# Patient Record
Sex: Male | Born: 1985 | Race: White | Hispanic: No | Marital: Married | Smoking: Current every day smoker
Health system: Southern US, Community
[De-identification: ages and names within clinical notes are randomized; demographics above are authoritative.]

## PROBLEM LIST (undated history)

## (undated) DIAGNOSIS — J449 Chronic obstructive pulmonary disease, unspecified: Secondary | ICD-10-CM

## (undated) DIAGNOSIS — S069X9A Unspecified intracranial injury with loss of consciousness of unspecified duration, initial encounter: Secondary | ICD-10-CM

## (undated) DIAGNOSIS — F319 Bipolar disorder, unspecified: Secondary | ICD-10-CM

## (undated) DIAGNOSIS — S069XAA Unspecified intracranial injury with loss of consciousness status unknown, initial encounter: Secondary | ICD-10-CM

## (undated) HISTORY — PX: NO PAST SURGERIES: SHX2092

---
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
## 2012-10-28 ENCOUNTER — Emergency Department (HOSPITAL_COMMUNITY)
Admission: EM | Admit: 2012-10-28 | Discharge: 2012-10-28 | Disposition: A | Discharge status: Home / Self Care | Payer: Self-pay | Attending: Emergency Medicine | Admitting: Emergency Medicine

## 2012-10-28 ENCOUNTER — Encounter (HOSPITAL_COMMUNITY): Payer: Self-pay | Admitting: Adult Health

## 2012-10-28 DIAGNOSIS — L259 Unspecified contact dermatitis, unspecified cause: Secondary | ICD-10-CM | POA: Insufficient documentation

## 2012-10-28 DIAGNOSIS — F17200 Nicotine dependence, unspecified, uncomplicated: Secondary | ICD-10-CM | POA: Insufficient documentation

## 2012-10-28 DIAGNOSIS — Z87820 Personal history of traumatic brain injury: Secondary | ICD-10-CM | POA: Insufficient documentation

## 2012-10-28 HISTORY — DX: Unspecified intracranial injury with loss of consciousness of unspecified duration, initial encounter: S06.9X9A

## 2012-10-28 HISTORY — DX: Unspecified intracranial injury with loss of consciousness status unknown, initial encounter: S06.9XAA

## 2012-10-28 MED ORDER — HYDROCORTISONE 1 % EX CREA: TOPICAL_CREAM | CUTANEOUS | Status: DC

## 2012-10-28 MED ORDER — PREDNISONE 20 MG PO TABS: 40.0000 mg | ORAL_TABLET | Freq: Every day | ORAL | Status: DC

## 2012-10-28 MED ORDER — DIPHENHYDRAMINE HCL 25 MG PO TABS: 25.0000 mg | ORAL_TABLET | Freq: Four times a day (QID) | ORAL | Status: DC | PRN

## 2012-10-28 NOTE — ED Notes (Signed)
Pt stated he is homeless.  Stated he noticed on red area on right arm yesterday and thought it was a mosquito bite.  The rash began to spread on right and left arm and on right and left hips.

## 2012-10-28 NOTE — ED Provider Notes (Signed)
History   This chart was scribed for Loren Racer, MD by Gerlean Ren, ED Scribe. This patient was seen in room TR05C/TR05C and the patient's care was started at 10:26 PM    CSN: [id number]  Arrival date & time 10/28/12  2047   First MD Initiated Contact with Patient 10/28/12 2207      Chief Complaint  Patient presents with  . Rash    The history is provided by the patient. No language interpreter was used.   Kristopher Kim is a 26 y.o. male who presents to the Emergency Department complaining of a mildly itching rash that began on left upper extremity yesterday and has spread to right arm and chest since. Pt denies any h/o similar rash.  Pt has h/o traumatic brain injury.  Pt is a current everyday smoker but denies alcohol use.   Past Medical History  Diagnosis Date  . Traumatic brain injury     History reviewed. No pertinent past surgical history.  History reviewed. No pertinent family history.  History  Substance Use Topics  . Smoking status: Current Every Day Smoker    Types: Cigarettes  . Smokeless tobacco: Not on file  . Alcohol Use: No      Review of Systems  Respiratory: Negative for shortness of breath.   Skin: Positive for rash.    Allergies  Ceclor and Penicillins  Home Medications   Current Outpatient Rx  Name  Route  Sig  Dispense  Refill  . DIPHENHYDRAMINE HCL 25 MG PO TABS   Oral   Take 1 tablet (25 mg total) by mouth every 6 (six) hours as needed for itching.   20 tablet   0   . HYDROCORTISONE 1 % EX CREA      Apply to affected area 2 times daily   15 g   0   . PREDNISONE 20 MG PO TABS   Oral   Take 2 tablets (40 mg total) by mouth daily.   10 tablet   0     BP 106/71  Pulse 62  Temp 98.3 F (36.8 C) (Oral)  Resp 18  SpO2 97%  Physical Exam  Nursing note and vitals reviewed. Constitutional: He is oriented to person, place, and time. He appears well-developed and well-nourished.  HENT:  Head: Normocephalic and  atraumatic.  Eyes: Conjunctivae normal and EOM are normal. Pupils are equal, round, and reactive to light.  Neck: Normal range of motion. Neck supple.  Cardiovascular: Normal rate, regular rhythm and normal heart sounds.   Pulmonary/Chest: Effort normal and breath sounds normal.  Abdominal: Soft. Bowel sounds are normal.  Musculoskeletal: Normal range of motion.  Neurological: He is alert and oriented to person, place, and time.  Skin: Skin is warm and dry. Rash noted.       Raised erythematous papules and macules over bilateral arms in linear patterns consistent with scratching and over chest.  No rash on back or face.  No rash on hands or webbing between fingers.  Psychiatric: He has a normal mood and affect.    ED Course  Procedures (including critical care time) DIAGNOSTIC STUDIES: Oxygen Saturation is 97% on room air, adequate by my interpretation.    COORDINATION OF CARE: 10:32 PM- Patient informed of clinical course, understands medical decision-making process, and agrees with plan.  Ordered hydrocortisone cream.      Labs Reviewed - No data to display No results found.   1. Contact dermatitis       MDM  I personally performed the services described in this documentation, which was scribed in my presence. The recorded information has been reviewed and is accurate.        Loren Racer, MD 10/29/12 [phone]

## 2012-10-28 NOTE — ED Notes (Signed)
Presents with bilateral extremity rash that began yesterday afternoon, rash has spread to trunk. Pt c/o itchiness.

## 2013-03-24 ENCOUNTER — Emergency Department (HOSPITAL_COMMUNITY): Payer: Self-pay

## 2013-03-24 ENCOUNTER — Emergency Department (HOSPITAL_COMMUNITY)
Admission: EM | Admit: 2013-03-24 | Discharge: 2013-03-24 | Disposition: A | Discharge status: Home / Self Care | Payer: Self-pay | Attending: Emergency Medicine | Admitting: Emergency Medicine

## 2013-03-24 ENCOUNTER — Encounter (HOSPITAL_COMMUNITY): Payer: Self-pay | Admitting: Emergency Medicine

## 2013-03-24 DIAGNOSIS — Z87820 Personal history of traumatic brain injury: Secondary | ICD-10-CM | POA: Insufficient documentation

## 2013-03-24 DIAGNOSIS — Z79899 Other long term (current) drug therapy: Secondary | ICD-10-CM | POA: Insufficient documentation

## 2013-03-24 DIAGNOSIS — F319 Bipolar disorder, unspecified: Secondary | ICD-10-CM | POA: Insufficient documentation

## 2013-03-24 DIAGNOSIS — Y929 Unspecified place or not applicable: Secondary | ICD-10-CM | POA: Insufficient documentation

## 2013-03-24 DIAGNOSIS — W108XXA Fall (on) (from) other stairs and steps, initial encounter: Secondary | ICD-10-CM | POA: Insufficient documentation

## 2013-03-24 DIAGNOSIS — S2232XA Fracture of one rib, left side, initial encounter for closed fracture: Secondary | ICD-10-CM

## 2013-03-24 DIAGNOSIS — S2239XA Fracture of one rib, unspecified side, initial encounter for closed fracture: Secondary | ICD-10-CM | POA: Insufficient documentation

## 2013-03-24 DIAGNOSIS — F17200 Nicotine dependence, unspecified, uncomplicated: Secondary | ICD-10-CM | POA: Insufficient documentation

## 2013-03-24 DIAGNOSIS — Y9301 Activity, walking, marching and hiking: Secondary | ICD-10-CM | POA: Insufficient documentation

## 2013-03-24 HISTORY — DX: Bipolar disorder, unspecified: F31.9

## 2013-03-24 MED ORDER — HYDROCODONE-ACETAMINOPHEN 5-325 MG PO TABS: 1.0000 | ORAL_TABLET | Freq: Three times a day (TID) | ORAL | Status: DC | PRN

## 2013-03-24 MED ORDER — IBUPROFEN 800 MG PO TABS
800.0000 mg | ORAL_TABLET | Freq: Three times a day (TID) | ORAL | Status: DC
Start: 2013-03-24 — End: 2013-05-25

## 2013-03-24 NOTE — ED Notes (Signed)
Pt c/o left sided rib pain after falling down stairs 5 days ago; pt denies SOB

## 2013-03-24 NOTE — ED Provider Notes (Signed)
History     CSN: [id number]  Arrival date & time 03/24/13  4098   First MD Initiated Contact with Patient 03/24/13 [phone]      Chief Complaint  Patient presents with  . Fall    (Consider location/radiation/quality/duration/timing/severity/associated sxs/prior treatment) Patient is a 27 y.o. male presenting with fall. The history is provided by the patient. No language interpreter was used.  Fall The accident occurred more than 2 days ago. The fall occurred while walking. He fell from a height of 3 to 5 ft. He landed on concrete. There was no blood loss. Point of impact: left side/back. Pain location: left side/back. The pain is at a severity of 6/10. The pain is moderate. He was ambulatory at the scene. There was no entrapment after the fall. There was no drug use involved in the accident. There was no alcohol use involved in the accident. Pertinent negatives include no visual change, no fever, no numbness, no abdominal pain, no bowel incontinence, no nausea, no vomiting, no hematuria, no headaches, no hearing loss, no loss of consciousness and no tingling. The symptoms are aggravated by activity (deep breathing). He has tried NSAIDs and rest for the symptoms. The treatment provided mild relief.  Pt c/o left side and back pain after falling down stairs 5 days ago.  States he was carrying tools down stairs when he tripped over his feet.  Denies hitting his head or loss of consciousness.  Pt states pain is sharp and dull in nature, is 3/10 at best and 6/10 at worst with deep breathing.  Certain movements and lying down also worsen the pain.  Did try Advil yesterday with some relief.  Denies shortness of breath.  Denies pain elsewhere.   Past Medical History  Diagnosis Date  . Traumatic brain injury   . Bipolar 1 disorder     History reviewed. No pertinent past surgical history.  History reviewed. No pertinent family history.  History  Substance Use Topics  . Smoking status: Current Every  Day Smoker    Types: Cigarettes  . Smokeless tobacco: Not on file  . Alcohol Use: Yes     Comment: occ      Review of Systems  Constitutional: Negative for fever.  Gastrointestinal: Negative for nausea, vomiting, abdominal pain and bowel incontinence.  Genitourinary: Negative for hematuria.  Neurological: Negative for tingling, loss of consciousness, numbness and headaches.    Allergies  Ceclor and Penicillins  Home Medications   Current Outpatient Rx  Name  Route  Sig  Dispense  Refill  . benztropine (COGENTIN) 1 MG tablet   Oral   Take 1 mg by mouth 2 (two) times daily.         . risperiDONE (RISPERDAL) 2 MG tablet   Oral   Take 2 mg by mouth 2 (two) times daily.         Marland Kitchen HYDROcodone-acetaminophen (NORCO/VICODIN) 5-325 MG per tablet   Oral   Take 1 tablet by mouth every 8 (eight) hours as needed for pain.   6 tablet   0   . ibuprofen (ADVIL,MOTRIN) 800 MG tablet   Oral   Take 1 tablet (800 mg total) by mouth 3 (three) times daily.   21 tablet   0     BP 96/56  Pulse 50  Temp(Src) 98.1 F (36.7 C) (Oral)  Resp 18  SpO2 97%  Physical Exam  Nursing note and vitals reviewed. Constitutional: He is oriented to person, place, and time. He appears well-developed  and well-nourished. No distress.  HENT:  Head: Normocephalic and atraumatic.  Eyes: Conjunctivae and EOM are normal. Pupils are equal, round, and reactive to light. Right eye exhibits no discharge. Left eye exhibits no discharge. No scleral icterus.  Neck: Normal range of motion. Neck supple. No JVD present. No tracheal deviation present. No thyromegaly present.  Cardiovascular: Regular rhythm and normal heart sounds.   Bradycardic   Pulmonary/Chest: Effort normal and breath sounds normal. No stridor. No respiratory distress. He has no wheezes. He has no rales. He exhibits tenderness ( left posterior chest wall).  Abdominal: Soft. Bowel sounds are normal. He exhibits no distension. There is no  tenderness.  Musculoskeletal: Normal range of motion. He exhibits edema and tenderness ( TTP over left back around T7-T10.  Palpable muscle spasm.  No abrassion, contusion or erythema.).  Lymphadenopathy:    He has no cervical adenopathy.  Neurological: He is alert and oriented to person, place, and time. No cranial nerve deficit. Coordination normal.  Skin: Skin is warm and dry. No rash noted. He is not diaphoretic. No erythema.  Psychiatric: He has a normal mood and affect. His behavior is normal. Judgment and thought content normal.    ED Course  Procedures (including critical care time)  Labs Reviewed - No data to display Dg Ribs Unilateral W/chest Left  03/24/2013  *RADIOLOGY REPORT*  Clinical Data: Fall, injury lateral mid left chest/ribs  LEFT RIBS AND CHEST - 3+ VIEW  Comparison: None Correlation:  CT chest 04/04/2009  Findings: Normal heart size, mediastinal contours, and pulmonary vascularity. Lungs clear. No pleural effusion or pneumothorax. BB placed at site of symptoms left chest. Questionable nondisplaced fracture posterior left eighth rib.  IMPRESSION: Questionable nondisplaced fracture posterior left eighth rib   Original Report Authenticated By: Ulyses Southward, M.D.      1. Rib fracture, left, closed, initial encounter       MDM  Pt c/o of 5 day hx of left sided back pain which started after he fell down some stairs.  Pain has not improved since fall.  Denies hitting his head or LOC.  Denies headache, blurred vision or loss of balance.  State he fell down the stairs while carrying some tools and tripped over his own feet.  Pt does work in [employer].  Has tried some Advil with some relief.  Lungs: CTAB.  TTP over posterior chest wall starting around T7-T10. Palpable muscle spasm appreciated over sight of tenderness. No wound, ecchymosis or erythema.  Dx: nondisplaced rib fraction, posterior left 8th rib.  Rx: norco and ibuprofen as needed for pain.  Will have pt f/u  with PCP or urgent care in 1-2wks if pain not improving.  Encouraged pt not to be afraid to take deep breaths.  Do not want pt to get pneumonia or atelectasis from shallow breathing. Advised pt of return precautions.   Vitals: unremarkable. Discharged in stable condition.    Discussed pt with attending during ED encounter.       Junius Finner, PA-C 03/24/13 1946

## 2013-03-26 NOTE — ED Provider Notes (Signed)
Medical screening examination/treatment/procedure(s) were performed by non-physician practitioner and as supervising physician I was immediately available for consultation/collaboration.   David H Yao, MD 03/26/13 0651 

## 2013-05-25 ENCOUNTER — Encounter (HOSPITAL_COMMUNITY): Payer: Self-pay | Admitting: Emergency Medicine

## 2013-05-25 ENCOUNTER — Emergency Department (HOSPITAL_COMMUNITY)
Admission: EM | Admit: 2013-05-25 | Discharge: 2013-05-25 | Disposition: A | Discharge status: Home / Self Care | Payer: Self-pay | Attending: Emergency Medicine | Admitting: Emergency Medicine

## 2013-05-25 ENCOUNTER — Emergency Department (HOSPITAL_COMMUNITY): Payer: Self-pay

## 2013-05-25 DIAGNOSIS — S8990XA Unspecified injury of unspecified lower leg, initial encounter: Secondary | ICD-10-CM | POA: Insufficient documentation

## 2013-05-25 DIAGNOSIS — S99919A Unspecified injury of unspecified ankle, initial encounter: Secondary | ICD-10-CM | POA: Insufficient documentation

## 2013-05-25 DIAGNOSIS — Y929 Unspecified place or not applicable: Secondary | ICD-10-CM | POA: Insufficient documentation

## 2013-05-25 DIAGNOSIS — F17200 Nicotine dependence, unspecified, uncomplicated: Secondary | ICD-10-CM | POA: Insufficient documentation

## 2013-05-25 DIAGNOSIS — Y939 Activity, unspecified: Secondary | ICD-10-CM | POA: Insufficient documentation

## 2013-05-25 DIAGNOSIS — Z880 Allergy status to penicillin: Secondary | ICD-10-CM | POA: Insufficient documentation

## 2013-05-25 DIAGNOSIS — M79672 Pain in left foot: Secondary | ICD-10-CM

## 2013-05-25 DIAGNOSIS — X58XXXA Exposure to other specified factors, initial encounter: Secondary | ICD-10-CM | POA: Insufficient documentation

## 2013-05-25 MED ORDER — NAPROXEN 500 MG PO TABS: 500.0000 mg | ORAL_TABLET | Freq: Two times a day (BID) | ORAL | Status: DC

## 2013-05-25 NOTE — ED Notes (Signed)
Pt. Stated, I was lifting with someone else a railroad tie and it dropped on my foot.  Rt. Big toe.

## 2013-05-25 NOTE — ED Provider Notes (Signed)
History    This chart was scribed for Doran Durand, non-physician practitioner working with Richardean Canal, MD by Leone Payor, ED Scribe. This patient was seen in room TR10C/TR10C and the patient's care was started at 1405.   CSN: [id number]  Arrival date & time 05/25/13  1405   First MD Initiated Contact with Patient 05/25/13 1527      Chief Complaint  Patient presents with  . Toe Injury     The history is provided by the patient. No language interpreter was used.    HPI Comments: Kristopher Kim is a 27 y.o. male who presents to the Emergency Department complaining of a R great toe and right foot injury that occurred 1.5 weeks ago. States a railroad tie was dropped on the top of his R foot in between his great toe and second toe. He rates his pain as 2-3/10 while sitting down, 4-5/10 while walking. His pain is severe when walking down stairs. Has taken Advil and flexeril with mild relief. He denies numbness or tingling.  He reports that he has noticed occasional swelling of the top of his right foot.  Pt is a current everyday smoker and occasional alcohol user.   Past Medical History  Diagnosis Date  . Traumatic brain injury   . Bipolar 1 disorder     History reviewed. No pertinent past surgical history.  No family history on file.  History  Substance Use Topics  . Smoking status: Current Every Day Smoker    Types: Cigarettes  . Smokeless tobacco: Not on file  . Alcohol Use: Yes     Comment: occ      Review of Systems  Musculoskeletal: Positive for arthralgias (toe pain).  Neurological: Negative for weakness and numbness.  All other systems reviewed and are negative.    Allergies  Ceclor and Penicillins  Home Medications   Current Outpatient Rx  Name  Route  Sig  Dispense  Refill  . cyclobenzaprine (FLEXERIL) 10 MG tablet   Oral   Take 10 mg by mouth 3 (three) times daily as needed for muscle spasms.         Marland Kitchen ibuprofen (ADVIL,MOTRIN) 200 MG tablet  Oral   Take 400 mg by mouth every 6 (six) hours as needed for pain.           BP 132/69  Pulse 68  Temp(Src) 98.2 F (36.8 C) (Oral)  Resp 20  SpO2 96%  Physical Exam  Nursing note and vitals reviewed. Constitutional: He appears well-developed and well-nourished.  HENT:  Head: Normocephalic and atraumatic.  Mouth/Throat: Oropharynx is clear and moist.  Eyes: EOM are normal. Pupils are equal, round, and reactive to light.  Neck: Normal range of motion. Neck supple.  Cardiovascular: Normal rate, regular rhythm and normal heart sounds.   Pulmonary/Chest: Effort normal and breath sounds normal. No respiratory distress. He has no wheezes. He has no rales.  Musculoskeletal: Normal range of motion. He exhibits tenderness.  R foot has tenderness to palpation over 1st metatarsal and 2nd metatarsal. No tenderness to palpation of 3rd-5th metatarsal. Able to wiggle toes. 2+ DP pulse. Sensation intact.   Neurological: He is alert.  Skin: Skin is warm and dry.  Psychiatric: He has a normal mood and affect. His behavior is normal.    ED Course  Procedures (including critical care time)  DIAGNOSTIC STUDIES: Oxygen Saturation is 96% on RA, adequate by my interpretation.    COORDINATION OF CARE: 4:41 PM Discussed treatment plan with  pt at bedside and pt agreed to plan.   Labs Reviewed - No data to display Dg Foot Complete Right  05/25/2013   *RADIOLOGY REPORT*  Clinical Data: Injury 1 week ago.  Pain along the anterior metatarsal bones.  RIGHT FOOT COMPLETE - 3+ VIEW  Comparison: None.  Findings: Three views of the right foot were obtained.  Negative for acute fracture or dislocation.  Alignment of the foot is within normal limits.  No gross soft tissue abnormality.  IMPRESSION: No acute bony abnormality to the right foot.   Original Report Authenticated By: Richarda Overlie, M.D.     No diagnosis found.    MDM  Patient presenting with foot pain after dropping a railroad tie on his foot 1.5  weeks ago.  Xray negative.  Patient neurovascularly intact.  Patient able to bear weight and ambulate.  Patient declined post op shoe or cam walker.  Feel that patient is stable for discharge.  I personally performed the services described in this documentation, which was scribed in my presence. The recorded information has been reviewed and is accurate.   Pascal Lux [place], PA-C 05/25/13 1655

## 2013-05-30 NOTE — ED Provider Notes (Signed)
Medical screening examination/treatment/procedure(s) were performed by non-physician practitioner and as supervising physician I was immediately available for consultation/collaboration.   Marquice Uddin H Taelar Gronewold, MD 05/30/13 0932 

## 2013-07-03 ENCOUNTER — Encounter (HOSPITAL_COMMUNITY): Payer: Self-pay

## 2013-07-03 ENCOUNTER — Emergency Department (HOSPITAL_COMMUNITY)
Admission: EM | Admit: 2013-07-03 | Discharge: 2013-07-04 | Disposition: A | Discharge status: Home / Self Care | Payer: Self-pay | Attending: Emergency Medicine | Admitting: Emergency Medicine

## 2013-07-03 ENCOUNTER — Emergency Department (HOSPITAL_COMMUNITY): Payer: Self-pay

## 2013-07-03 DIAGNOSIS — R112 Nausea with vomiting, unspecified: Secondary | ICD-10-CM | POA: Insufficient documentation

## 2013-07-03 DIAGNOSIS — F17200 Nicotine dependence, unspecified, uncomplicated: Secondary | ICD-10-CM | POA: Insufficient documentation

## 2013-07-03 DIAGNOSIS — Z880 Allergy status to penicillin: Secondary | ICD-10-CM | POA: Insufficient documentation

## 2013-07-03 DIAGNOSIS — Z8659 Personal history of other mental and behavioral disorders: Secondary | ICD-10-CM | POA: Insufficient documentation

## 2013-07-03 DIAGNOSIS — Z87820 Personal history of traumatic brain injury: Secondary | ICD-10-CM | POA: Insufficient documentation

## 2013-07-03 DIAGNOSIS — J40 Bronchitis, not specified as acute or chronic: Secondary | ICD-10-CM | POA: Insufficient documentation

## 2013-07-03 MED ORDER — DEXTROMETHORPHAN POLISTIREX 30 MG/5ML PO LQCR
15.0000 mg | Freq: Once | ORAL | Status: AC
  Administered 2013-07-03: 15 mg via ORAL
  Filled 2013-07-03: qty 5

## 2013-07-03 MED ORDER — ONDANSETRON 4 MG PO TBDP
4.0000 mg | ORAL_TABLET | Freq: Once | ORAL | Status: AC
  Administered 2013-07-03: 4 mg via ORAL
  Filled 2013-07-03: qty 1

## 2013-07-03 NOTE — ED Provider Notes (Signed)
CSN: [id number]     Arrival date & time 07/03/13  1953 History     First MD Initiated Contact with Patient 07/03/13 2349     Chief Complaint  Patient presents with  . Cough  . Nausea   (Consider location/radiation/quality/duration/timing/severity/associated sxs/prior Treatment) HPI Comments: Patient is a 27 year old male with a past medical history of bipolar 1 disorder who presents with a 3 day history of sinus pressure. Symptoms started gradually and progressively worsened since the onset. Patient reports associated productive cough with green mucous as well as vomiting. Patient denies any other associated symptoms. Patient has not tried anything for symptoms. No aggravating/alleviating factors. No known sick contacts.    Past Medical History  Diagnosis Date  . Traumatic brain injury   . Bipolar 1 disorder    History reviewed. No pertinent past surgical history. History reviewed. No pertinent family history. History  Substance Use Topics  . Smoking status: Current Every Day Smoker    Types: Cigarettes  . Smokeless tobacco: Not on file  . Alcohol Use: Yes     Comment: occ    Review of Systems  HENT: Positive for sinus pressure.   Respiratory: Positive for cough.   Gastrointestinal: Positive for nausea.  All other systems reviewed and are negative.    Allergies  Ceclor and Penicillins  Home Medications   Current Outpatient Rx  Name  Route  Sig  Dispense  Refill  . OVER THE COUNTER MEDICATION   Oral   Take 1 tablet by mouth once. OTC Day/Cold medication          BP 126/77  Pulse 76  Temp(Src) 98.3 F (36.8 C) (Oral)  Resp 20  SpO2 96% Physical Exam  Nursing note and vitals reviewed. Constitutional: He is oriented to person, place, and time. He appears well-developed and well-nourished. No distress.  HENT:  Head: Normocephalic and atraumatic.  Mouth/Throat: Oropharynx is clear and moist. No oropharyngeal exudate.  Eyes: Conjunctivae are normal.  Neck:  Normal range of motion.  Cardiovascular: Normal rate and regular rhythm.  Exam reveals no gallop and no friction rub.   No murmur heard. Pulmonary/Chest: Effort normal and breath sounds normal. He has no wheezes. He has no rales. He exhibits no tenderness.  Abdominal: Soft. There is no tenderness.  Musculoskeletal: Normal range of motion.  Lymphadenopathy:    He has no cervical adenopathy.  Neurological: He is alert and oriented to person, place, and time. Coordination normal.  Speech is goal-oriented. Moves limbs without ataxia.   Skin: Skin is warm and dry.  Psychiatric: He has a normal mood and affect. His behavior is normal.    ED Course   Procedures (including critical care time)  Labs Reviewed - No data to display Dg Chest 2 View  07/03/2013   *RADIOLOGY REPORT*  Clinical Data: Cough  CHEST - 2 VIEW  Comparison: March 24, 2013  Findings:  Lungs are clear.  Heart size and pulmonary vascularity are normal.  No adenopathy.  No bone lesions.  IMPRESSION: No abnormality noted.   Original Report Authenticated By: Bretta Bang, M.D.   1. Bronchitis     MDM  11:57 PM Chest xray unremarkable for acute changes. Patient will have delsym for cough and zofran for nausea. Vitals stable and patient afebrile at this time.   12:27 AM Patient likely has bronchitis and will have prescription for azithromycin, delsym and zofran. Patient will be instructed to fill Azithromycin prescription if symptoms do not resolve. Vitals stable  and patient afebrile.   Emilia Beck, PA-C 07/04/13 [phone]

## 2013-07-03 NOTE — ED Notes (Signed)
Pt lives at the [facility] and three days ago developed a cough and has been vomiting, was able to keep his dinner down tonight. States that his cough is getting worse and its a productive cough with a greenish mucous.

## 2013-07-04 MED ORDER — DEXTROMETHORPHAN POLISTIREX 30 MG/5ML PO LQCR: 60.0000 mg | ORAL | Status: DC | PRN

## 2013-07-04 MED ORDER — AZITHROMYCIN 250 MG PO TABS: 250.0000 mg | ORAL_TABLET | Freq: Every day | ORAL | Status: DC

## 2013-07-04 MED ORDER — PROMETHAZINE HCL 25 MG PO TABS: 25.0000 mg | ORAL_TABLET | Freq: Four times a day (QID) | ORAL | Status: DC | PRN

## 2013-07-04 NOTE — ED Provider Notes (Signed)
Medical screening examination/treatment/procedure(s) were performed by non-physician practitioner and as supervising physician I was immediately available for consultation/collaboration.  Jones Skene, M.D.   Jones Skene, MD 07/04/13 [phone]

## 2013-07-05 ENCOUNTER — Encounter (HOSPITAL_COMMUNITY): Payer: Self-pay | Admitting: Emergency Medicine

## 2013-07-05 ENCOUNTER — Emergency Department (HOSPITAL_COMMUNITY)
Admission: EM | Admit: 2013-07-05 | Discharge: 2013-07-05 | Disposition: A | Discharge status: Home / Self Care | Payer: Self-pay | Attending: Emergency Medicine | Admitting: Emergency Medicine

## 2013-07-05 DIAGNOSIS — R062 Wheezing: Secondary | ICD-10-CM | POA: Insufficient documentation

## 2013-07-05 DIAGNOSIS — F17200 Nicotine dependence, unspecified, uncomplicated: Secondary | ICD-10-CM | POA: Insufficient documentation

## 2013-07-05 DIAGNOSIS — Z8659 Personal history of other mental and behavioral disorders: Secondary | ICD-10-CM | POA: Insufficient documentation

## 2013-07-05 DIAGNOSIS — J3489 Other specified disorders of nose and nasal sinuses: Secondary | ICD-10-CM | POA: Insufficient documentation

## 2013-07-05 DIAGNOSIS — L539 Erythematous condition, unspecified: Secondary | ICD-10-CM | POA: Insufficient documentation

## 2013-07-05 DIAGNOSIS — Z79899 Other long term (current) drug therapy: Secondary | ICD-10-CM | POA: Insufficient documentation

## 2013-07-05 DIAGNOSIS — Z720 Tobacco use: Secondary | ICD-10-CM

## 2013-07-05 DIAGNOSIS — Z87820 Personal history of traumatic brain injury: Secondary | ICD-10-CM | POA: Insufficient documentation

## 2013-07-05 DIAGNOSIS — Z880 Allergy status to penicillin: Secondary | ICD-10-CM | POA: Insufficient documentation

## 2013-07-05 DIAGNOSIS — J069 Acute upper respiratory infection, unspecified: Secondary | ICD-10-CM | POA: Insufficient documentation

## 2013-07-05 DIAGNOSIS — R599 Enlarged lymph nodes, unspecified: Secondary | ICD-10-CM | POA: Insufficient documentation

## 2013-07-05 MED ORDER — ALBUTEROL SULFATE (5 MG/ML) 0.5% IN NEBU
5.0000 mg | INHALATION_SOLUTION | Freq: Once | RESPIRATORY_TRACT | Status: AC
  Administered 2013-07-05: 5 mg via RESPIRATORY_TRACT
  Filled 2013-07-05: qty 0.5

## 2013-07-05 MED ORDER — IPRATROPIUM BROMIDE 0.02 % IN SOLN
0.5000 mg | Freq: Once | RESPIRATORY_TRACT | Status: AC
  Administered 2013-07-05: 0.5 mg via RESPIRATORY_TRACT
  Filled 2013-07-05: qty 2.5

## 2013-07-05 MED ORDER — ALBUTEROL SULFATE HFA 108 (90 BASE) MCG/ACT IN AERS: 2.0000 | INHALATION_SPRAY | RESPIRATORY_TRACT | Status: DC | PRN

## 2013-07-05 MED ORDER — AZITHROMYCIN 250 MG PO TABS: ORAL_TABLET | ORAL | Status: DC

## 2013-07-05 NOTE — ED Provider Notes (Signed)
CSN: [id number]     Arrival date & time 07/05/13  1841 History  This chart was scribed for Junious Silk, PA-C working with Suzi Roots, MD by Greggory Stallion, ED scribe. This patient was seen in room WTR5/WTR5 and the patient's care was started at 7:07 PM.   Chief Complaint  Patient presents with  . Cough   The history is provided by the patient. No language interpreter was used.    HPI Comments: Kristopher Kim is a 27 y.o. male who presents to the Emergency Department complaining of sudden onset, gradually worsening unproductive cough with associated congestion that started 5 days ago. Pt states sometimes he has some mucus come out but not often. He states he was here 2 days ago for the same symptoms and was told he had a sinus infection. Pt states he was given a Z-pack prescription and never filled it. He cannot afford delsym. He has not used symptomatic therapy.    Past Medical History  Diagnosis Date  . Traumatic brain injury   . Bipolar 1 disorder    History reviewed. No pertinent past surgical history. History reviewed. No pertinent family history. History  Substance Use Topics  . Smoking status: Current Every Day Smoker    Types: Cigarettes  . Smokeless tobacco: Not on file  . Alcohol Use: Yes     Comment: occ    Review of Systems  HENT: Positive for congestion.   Respiratory: Positive for cough.   All other systems reviewed and are negative.    Allergies  Ceclor and Penicillins  Home Medications   Current Outpatient Rx  Name  Route  Sig  Dispense  Refill  . azithromycin (ZITHROMAX Z-PAK) 250 MG tablet   Oral   Take 1 tablet (250 mg total) by mouth daily. 500mg  PO day 1, then 250mg  PO days 205   6 tablet   0   . dextromethorphan (DELSYM) 30 MG/5ML liquid   Oral   Take 10 mLs (60 mg total) by mouth as needed for cough.   89 mL   0   . OVER THE COUNTER MEDICATION   Oral   Take 1 tablet by mouth once. OTC Day/Cold medication         . promethazine  (PHENERGAN) 25 MG tablet   Oral   Take 1 tablet (25 mg total) by mouth every 6 (six) hours as needed for nausea.   12 tablet   0    BP 122/79  Pulse 76  Temp(Src) 98.7 F (37.1 C) (Oral)  Resp 18  SpO2 100%  Physical Exam  Nursing note and vitals reviewed. Constitutional: He is oriented to person, place, and time. He appears well-developed and well-nourished. No distress.  HENT:  Head: Normocephalic and atraumatic. No trismus in the jaw.  Right Ear: External ear normal.  Left Ear: External ear normal.  Nose: Nose normal.  Mouth/Throat: Uvula is midline. Posterior oropharyngeal erythema (mild) present.  Eyes: Conjunctivae are normal.  Neck: Normal range of motion. No tracheal deviation present.  Cardiovascular: Normal rate, regular rhythm and normal heart sounds.   Pulmonary/Chest: Effort normal. No stridor. He has wheezes.  Mild wheezing on expiration.   Abdominal: Soft. He exhibits no distension. There is no tenderness.  Musculoskeletal: Normal range of motion.  Lymphadenopathy:    He has cervical adenopathy.  Neurological: He is alert and oriented to person, place, and time.  Skin: Skin is warm and dry. He is not diaphoretic.  Psychiatric: He has a  normal mood and affect. His behavior is normal.    ED Course   Procedures (including critical care time)  DIAGNOSTIC STUDIES: Oxygen Saturation is 100% on RA, normal by my interpretation.    COORDINATION OF CARE: 7:33 PM-Discussed treatment plan which includes breathing treatment, decongestants and to fill his Z-pack prescription with pt at bedside and pt agreed to plan.   Labs Reviewed - No data to display Dg Chest 2 View  07/03/2013   *RADIOLOGY REPORT*  Clinical Data: Cough  CHEST - 2 VIEW  Comparison: March 24, 2013  Findings:  Lungs are clear.  Heart size and pulmonary vascularity are normal.  No adenopathy.  No bone lesions.  IMPRESSION: No abnormality noted.   Original Report Authenticated By: Bretta Bang,  M.D.   1. URI (upper respiratory infection)   2. Tobacco abuse     MDM  Patient feels improved after breathing treatment. Condition stable since last visit. Given albuterol inhaler. Smoking cessation discussed. Vital signs are all WNL. Patient did not fill any of the prescriptions given to him at his last visit on 7/24. He was given a z-pack again to per request so that he could go straight to the pharmacy to pick up his medication. O2 at 100% on room air. Robitussin for cough if he cannot afford the delsym. Return instructions given. Vital signs stable for discharge. Patient / Family / Caregiver informed of clinical course, understand medical decision-making process, and agree with plan.      I personally performed the services described in this documentation, which was scribed in my presence. The recorded information has been reviewed and is accurate.    Mora Bellman, PA-C 07/05/13 2014

## 2013-07-05 NOTE — ED Notes (Signed)
Patient states he was here 2 days ago with the same. The patient reports that he is having a cough with horse voice. The patient reports that he has some SOB

## 2013-07-08 NOTE — ED Provider Notes (Signed)
Medical screening examination/treatment/procedure(s) were performed by non-physician practitioner and as supervising physician I was immediately available for consultation/collaboration.   Suzi Roots, MD 07/08/13 [phone]

## 2013-07-16 ENCOUNTER — Emergency Department (HOSPITAL_COMMUNITY)
Admission: EM | Admit: 2013-07-16 | Discharge: 2013-07-16 | Disposition: A | Discharge status: Home / Self Care | Payer: Self-pay | Attending: Emergency Medicine | Admitting: Emergency Medicine

## 2013-07-16 ENCOUNTER — Encounter (HOSPITAL_COMMUNITY): Payer: Self-pay | Admitting: Emergency Medicine

## 2013-07-16 DIAGNOSIS — Z87828 Personal history of other (healed) physical injury and trauma: Secondary | ICD-10-CM | POA: Insufficient documentation

## 2013-07-16 DIAGNOSIS — Z880 Allergy status to penicillin: Secondary | ICD-10-CM | POA: Insufficient documentation

## 2013-07-16 DIAGNOSIS — R197 Diarrhea, unspecified: Secondary | ICD-10-CM | POA: Insufficient documentation

## 2013-07-16 DIAGNOSIS — J029 Acute pharyngitis, unspecified: Secondary | ICD-10-CM | POA: Insufficient documentation

## 2013-07-16 DIAGNOSIS — F17200 Nicotine dependence, unspecified, uncomplicated: Secondary | ICD-10-CM | POA: Insufficient documentation

## 2013-07-16 DIAGNOSIS — R630 Anorexia: Secondary | ICD-10-CM | POA: Insufficient documentation

## 2013-07-16 DIAGNOSIS — R509 Fever, unspecified: Secondary | ICD-10-CM | POA: Insufficient documentation

## 2013-07-16 DIAGNOSIS — Z79899 Other long term (current) drug therapy: Secondary | ICD-10-CM | POA: Insufficient documentation

## 2013-07-16 DIAGNOSIS — Z8659 Personal history of other mental and behavioral disorders: Secondary | ICD-10-CM | POA: Insufficient documentation

## 2013-07-16 DIAGNOSIS — R059 Cough, unspecified: Secondary | ICD-10-CM | POA: Insufficient documentation

## 2013-07-16 DIAGNOSIS — R05 Cough: Secondary | ICD-10-CM

## 2013-07-16 DIAGNOSIS — Z792 Long term (current) use of antibiotics: Secondary | ICD-10-CM | POA: Insufficient documentation

## 2013-07-16 MED ORDER — ACETAMINOPHEN 325 MG PO TABS
650.0000 mg | ORAL_TABLET | Freq: Four times a day (QID) | ORAL | Status: DC | PRN
  Administered 2013-07-16: 650 mg via ORAL
  Filled 2013-07-16: qty 2

## 2013-07-16 NOTE — ED Notes (Signed)
Called pt to take to room without response from lobby

## 2013-07-16 NOTE — ED Notes (Signed)
Pt ambulatory to exam room with steady gait. No acute distress.  

## 2013-07-16 NOTE — ED Provider Notes (Signed)
CSN: [id number]     Arrival date & time 07/16/13  1657 History    This chart was scribed for Junius Finner, PA working with Flint Melter, MD by Quintella Reichert, ED Scribe. This patient was seen in room WTR9/WTR9 and the patient's care was started at 8:42 PM.     Chief Complaint  Patient presents with  . Fever  . Sore Throat    The history is provided by the patient. No language interpreter was used.    HPI Comments: Kristopher Kim is a 27 y.o. male who presents to the Emergency Department complaining of 2 weeks of progressively-worsening sore throat with accompanying fever, diarrhea and decreased appetite.  Pt reports that his tonsils have also been red and swollen.  He states that he did not realize he had a fever but on admission temperature is 101 F.  He has been seen in the ED 2 times for the same symptoms and prescribed antibiotics but he states he did not pick them up because he could not afford them.  He has attempted to treat symptoms with Tylenol, with some temporary relief.  However today his pain returned and he has used Extra Strength Tylenol, without relief this time.  Pt notes that he was recently exposed to similar symptoms at home.  He is a current half-pack-a-day smoker.  He denies h/o asthma, HTN or any other chronic physical conditions.   Past Medical History  Diagnosis Date  . Traumatic brain injury   . Bipolar 1 disorder     History reviewed. No pertinent past surgical history.   No family history on file.   History  Substance Use Topics  . Smoking status: Current Every Day Smoker    Types: Cigarettes  . Smokeless tobacco: Never Used  . Alcohol Use: Yes     Comment: occ     Review of Systems  Constitutional: Positive for fever and appetite change.  HENT: Positive for sore throat.   Gastrointestinal: Positive for diarrhea.  All other systems reviewed and are negative.      Allergies  Ceclor and Penicillins  Home Medications   Current  Outpatient Rx  Name  Route  Sig  Dispense  Refill  . acetaminophen (TYLENOL) 500 MG tablet   Oral   Take 1,000 mg by mouth every 6 (six) hours as needed for pain.         Marland Kitchen albuterol (PROVENTIL HFA;VENTOLIN HFA) 108 (90 BASE) MCG/ACT inhaler   Inhalation   Inhale 2 puffs into the lungs every 4 (four) hours as needed for wheezing.   1 Inhaler   0   . azithromycin (ZITHROMAX Z-PAK) 250 MG tablet      2 po day one, then 1 daily x 4 days   5 tablet   0    BP 112/71  Pulse 84  Temp(Src) 100 F (37.8 C) (Oral)  Resp 20  SpO2 100%  Physical Exam  Nursing note and vitals reviewed. Constitutional: He is oriented to person, place, and time. He appears well-developed and well-nourished.  HENT:  Head: Normocephalic and atraumatic.  Right Ear: Tympanic membrane is erythematous. Tympanic membrane is not bulging.  Left Ear: Tympanic membrane is erythematous. Tympanic membrane is not bulging.  Mouth/Throat: Uvula is midline and mucous membranes are normal. Oropharyngeal exudate, posterior oropharyngeal edema and posterior oropharyngeal erythema present. No tonsillar abscesses.  Eyes: EOM are normal.  Neck: Normal range of motion.  Cardiovascular: Normal rate, regular rhythm and normal heart sounds.  Pulmonary/Chest: Effort normal and breath sounds normal. No respiratory distress. He has no wheezes. He has no rales.  Musculoskeletal: Normal range of motion.  Neurological: He is alert and oriented to person, place, and time.  Skin: Skin is warm and dry.  Psychiatric: He has a normal mood and affect. His behavior is normal.    ED Course  Procedures (including critical care time)  DIAGNOSTIC STUDIES: Oxygen Saturation is 100% on room air, normal by my interpretation.    COORDINATION OF CARE: 8:49 PM-Discussed treatment plan which includes antibiotics with pt at bedside and pt agreed to plan.     Labs Reviewed  RAPID STREP SCREEN  CULTURE, GROUP A STREP   No results  found.  1. Sore throat   2. Cough   3. Fever     MDM  Pt presenting with worsening URI symptoms.  Pt would benefit from antibiotic use. No peritonsillar abscess visible.  Pt able to breath and swallow w/o difficulty.  Vitals: temp-101, otherwise unremarkable.  Pt still has active prescription for azithryomycin and brought paper prescription to ED today.  Advised pt to check with [facility] with $4.95 annual fee.  Info provided to pt.   I personally performed the services described in this documentation, which was scribed in my presence. The recorded information has been reviewed and is accurate.    Junius Finner, PA-C 07/17/13 2221

## 2013-07-16 NOTE — ED Notes (Addendum)
Pt c/o sore throat that has been going on for almost two weeks. Pt seen on 7/24 and 7/26 for same symptoms. Pt has red and swollen tonsils. Pt temp in triage was 101.0 F. Pt is A/O x4. NAD.

## 2013-07-18 LAB — CULTURE, GROUP A STREP

## 2013-07-18 NOTE — ED Provider Notes (Signed)
Medical screening examination/treatment/procedure(s) were performed by non-physician practitioner and as supervising physician I was immediately available for consultation/collaboration.  Joss Mcdill L Kerly Rigsbee, MD 07/18/13 1552 

## 2013-08-12 ENCOUNTER — Emergency Department (HOSPITAL_COMMUNITY)
Admission: EM | Admit: 2013-08-12 | Discharge: 2013-08-12 | Disposition: A | Discharge status: Home / Self Care | Payer: Self-pay | Attending: Emergency Medicine | Admitting: Emergency Medicine

## 2013-08-12 ENCOUNTER — Encounter (HOSPITAL_COMMUNITY): Payer: Self-pay | Admitting: Family Medicine

## 2013-08-12 DIAGNOSIS — W57XXXA Bitten or stung by nonvenomous insect and other nonvenomous arthropods, initial encounter: Secondary | ICD-10-CM

## 2013-08-12 DIAGNOSIS — Y939 Activity, unspecified: Secondary | ICD-10-CM | POA: Insufficient documentation

## 2013-08-12 DIAGNOSIS — Y9289 Other specified places as the place of occurrence of the external cause: Secondary | ICD-10-CM | POA: Insufficient documentation

## 2013-08-12 DIAGNOSIS — Z8659 Personal history of other mental and behavioral disorders: Secondary | ICD-10-CM | POA: Insufficient documentation

## 2013-08-12 DIAGNOSIS — Z880 Allergy status to penicillin: Secondary | ICD-10-CM | POA: Insufficient documentation

## 2013-08-12 DIAGNOSIS — F17200 Nicotine dependence, unspecified, uncomplicated: Secondary | ICD-10-CM | POA: Insufficient documentation

## 2013-08-12 DIAGNOSIS — S90569A Insect bite (nonvenomous), unspecified ankle, initial encounter: Secondary | ICD-10-CM | POA: Insufficient documentation

## 2013-08-12 DIAGNOSIS — Z87820 Personal history of traumatic brain injury: Secondary | ICD-10-CM | POA: Insufficient documentation

## 2013-08-12 MED ORDER — FAMOTIDINE 20 MG PO TABS
20.0000 mg | ORAL_TABLET | Freq: Once | ORAL | Status: AC
  Administered 2013-08-12: 20 mg via ORAL
  Filled 2013-08-12: qty 1

## 2013-08-12 MED ORDER — FAMOTIDINE 20 MG PO TABS: 20.0000 mg | ORAL_TABLET | Freq: Two times a day (BID) | ORAL | Status: DC

## 2013-08-12 NOTE — ED Provider Notes (Signed)
CSN: [id number]     Arrival date & time 08/12/13  1947 History  This chart was scribed for non-physician practitioner, Earley Favor, FNP working with Juliet Rude. Rubin Payor, MD by Greggory Stallion, ED scribe. This patient was seen in room WTR7/WTR7 and the patient's care was started at 9:51 PM.   Chief Complaint  Patient presents with  . Rash   The history is provided by the patient. No language interpreter was used.    HPI Comments: Kristopher Kim is a 27 y.o. male who presents to the Emergency Department complaining of gradual onset itchy rash to his lower legs, arms, and right thigh that started 2-3 days ago. He states the spot on his right thigh is more like a knot that's red and itchy. Pt denies SOB, cough and trouble swallowing. He was here one week ago for different symptoms and given an antibiotic but couldn't fill it since he couldn't afford it. 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   Past Medical History  Diagnosis Date  . Traumatic brain injury   . Bipolar 1 disorder    History reviewed. No pertinent past surgical history. No family history on file. History  Substance Use Topics  . Smoking status: Current Every Day Smoker -- 1.00 packs/day    Types: Cigarettes  . Smokeless tobacco: Never Used  . Alcohol Use: Yes     Comment: Occcasional    Review of Systems  HENT: Negative for trouble swallowing.   Respiratory: Negative for cough and shortness of breath.   Musculoskeletal: Negative for myalgias.  Skin: Positive for rash.  All other systems reviewed and are negative.    Allergies  Ceclor and Penicillins  Home Medications  No current outpatient prescriptions on file.  BP 111/71  Pulse 72  Temp(Src) 98.6 F (37 C) (Oral)  Resp 22  Ht 6\' 1"  (1.854 m)  Wt 200 lb (90.719 kg)  BMI 26.39 kg/m2  SpO2 99%  Physical Exam  Nursing note and vitals reviewed. Constitutional: He is oriented to person, place, and time. He appears well-developed and  well-nourished. No distress.  HENT:  Head: Normocephalic and atraumatic.  Eyes: Conjunctivae and EOM are normal. Pupils are equal, round, and reactive to light.  Neck: Normal range of motion. No tracheal deviation present.  Cardiovascular: Normal rate and regular rhythm.   Pulmonary/Chest: Effort normal and breath sounds normal. No respiratory distress. He has no wheezes. He has no rales.  Musculoskeletal: Normal range of motion.  Lymphadenopathy:    He has no cervical adenopathy.  Neurological: He is alert and oriented to person, place, and time.  Skin: Skin is warm and dry. Rash noted.  Right thigh is warm to touch. No fluctuance or surrounding erythema.  Multiple vague raised areas on lower extremities, and exposed areas of his arms, without surrounding erythema.  Not painful to touch, and blanch  Psychiatric: He has a normal mood and affect. His behavior is normal.    ED Course  Procedures (including critical care time)  DIAGNOSTIC STUDIES: Oxygen Saturation is 99% on RA, normal by my interpretation.    COORDINATION OF CARE: 10:04 PM-Discussed treatment plan which includes cool compress to right thigh, Benadryl and pepsid with pt at bedside and pt agreed to plan. Explained to pt nothing looks infected since his vitals are normal and he doesn't have a fever.   Labs Review Labs Reviewed - No data to display Imaging Review No results found.  MDM  No diagnosis found.  Patient works as a Corporate investment banker.  Most recently, as a leg holder wearing T-shirt and shorts during his working hours.  He does walk into the woods to relieve himself.  He denies myalgias, or shortness of breath, difficulty swallowing.  Does appear to be bites with a confluence of several on his lateral right thigh  , just underhem of his shorts.  He does not appear toxic or ill       I personally performed the services described in this documentation, which was scribed in my presence. The recorded  information has been reviewed and is accurate.  Arman Filter, NP 08/12/13 2220

## 2013-08-12 NOTE — ED Notes (Signed)
Patient states that he has had a rash for the past 2-3 days. States he has "bumps to his legs, arms and right thigh." Areas are red and patient states that they itch.

## 2013-08-13 NOTE — ED Provider Notes (Signed)
Medical screening examination/treatment/procedure(s) were performed by non-physician practitioner and as supervising physician I was immediately available for consultation/collaboration.  Jewelene Mairena R. Tannen Vandezande, MD 08/13/13 0040 

## 2013-12-13 ENCOUNTER — Emergency Department (HOSPITAL_COMMUNITY)
Admission: EM | Admit: 2013-12-13 | Discharge: 2013-12-13 | Disposition: A | Discharge status: Home / Self Care | Payer: Self-pay | Attending: Emergency Medicine | Admitting: Emergency Medicine

## 2013-12-13 ENCOUNTER — Encounter (HOSPITAL_COMMUNITY): Payer: Self-pay | Admitting: Emergency Medicine

## 2013-12-13 ENCOUNTER — Emergency Department (HOSPITAL_COMMUNITY): Payer: Self-pay

## 2013-12-13 DIAGNOSIS — Z8659 Personal history of other mental and behavioral disorders: Secondary | ICD-10-CM | POA: Insufficient documentation

## 2013-12-13 DIAGNOSIS — J449 Chronic obstructive pulmonary disease, unspecified: Secondary | ICD-10-CM

## 2013-12-13 DIAGNOSIS — R059 Cough, unspecified: Secondary | ICD-10-CM

## 2013-12-13 DIAGNOSIS — Z79899 Other long term (current) drug therapy: Secondary | ICD-10-CM | POA: Insufficient documentation

## 2013-12-13 DIAGNOSIS — Z87820 Personal history of traumatic brain injury: Secondary | ICD-10-CM | POA: Insufficient documentation

## 2013-12-13 DIAGNOSIS — Z880 Allergy status to penicillin: Secondary | ICD-10-CM | POA: Insufficient documentation

## 2013-12-13 DIAGNOSIS — R05 Cough: Secondary | ICD-10-CM

## 2013-12-13 DIAGNOSIS — F17200 Nicotine dependence, unspecified, uncomplicated: Secondary | ICD-10-CM | POA: Insufficient documentation

## 2013-12-13 DIAGNOSIS — J441 Chronic obstructive pulmonary disease with (acute) exacerbation: Secondary | ICD-10-CM | POA: Insufficient documentation

## 2013-12-13 MED ORDER — ALBUTEROL SULFATE HFA 108 (90 BASE) MCG/ACT IN AERS
2.0000 | INHALATION_SPRAY | RESPIRATORY_TRACT | Status: DC | PRN
  Administered 2013-12-13: 2 via RESPIRATORY_TRACT
  Filled 2013-12-13: qty 6.7

## 2013-12-13 NOTE — ED Provider Notes (Signed)
CSN: [id number]     Arrival date & time 12/13/13  1349 History  This chart was scribed for non-physician practitioner, Junius Finner, PA-C working with Dagmar Hait, MD by Greggory Stallion, ED scribe. This patient was seen in room TR07C/TR07C and the patient's care was started at 2:47 PM.   Chief Complaint  Patient presents with  . Cough   The history is provided by the patient. No language interpreter was used.   HPI Comments: Kristopher Kim is a 28 y.o. male who presents to the Emergency Department complaining of intermittent productive cough of brown sputum with associated SOB that started 8-10 months ago. He states it is hard to expand his lungs and it hurts to breathe. Pt has been a smoker for 21 years. States symptoms are worse after he smokes. He was prescribed an albuterol inhaler last year that provided moderate relief but he no longer has an inhaler and does not have a PCP.  Denies fever, nausea, emesis, sore throat. Denies history of asthma. Pt states he is going to quit smoking.   Past Medical History  Diagnosis Date  . Traumatic brain injury   . Bipolar 1 disorder    History reviewed. No pertinent past surgical history. History reviewed. No pertinent family history. History  Substance Use Topics  . Smoking status: Current Every Day Smoker -- 1.00 packs/day    Types: Cigarettes  . Smokeless tobacco: Never Used  . Alcohol Use: Yes     Comment: Occcasional    Review of Systems  Constitutional: Negative for fever.  HENT: Negative for sore throat.   Respiratory: Positive for cough and shortness of breath.   Gastrointestinal: Negative for nausea and vomiting.  All other systems reviewed and are negative.    Allergies  Ceclor and Penicillins  Home Medications   Current Outpatient Rx  Name  Route  Sig  Dispense  Refill  . famotidine (PEPCID) 20 MG tablet   Oral   Take 1 tablet (20 mg total) by mouth 2 (two) times daily.   30 tablet   0    BP 130/65  Pulse  82  Temp(Src) 98.2 F (36.8 C) (Oral)  Resp 18  Wt 188 lb 9.6 oz (85.548 kg)  SpO2 99%  Physical Exam  Nursing note and vitals reviewed. Constitutional: He appears well-developed and well-nourished.  HENT:  Head: Normocephalic and atraumatic.  Right Ear: Hearing, tympanic membrane, external ear and ear canal normal.  Left Ear: Hearing, tympanic membrane, external ear and ear canal normal.  Nose: Nose normal.  Mouth/Throat: Uvula is midline and mucous membranes are normal. Posterior oropharyngeal erythema present. No oropharyngeal exudate, posterior oropharyngeal edema or tonsillar abscesses.  Eyes: Conjunctivae are normal. No scleral icterus.  Neck: Normal range of motion.  Cardiovascular: Normal rate, regular rhythm and normal heart sounds.   Pulmonary/Chest: Effort normal and breath sounds normal. No respiratory distress. He has no wheezes. He has no rales. He exhibits no tenderness.  No respiratory distress, able to speak in full sentences w/o difficulty. Lungs: CTAB  Abdominal: Soft. There is no tenderness.  Musculoskeletal: Normal range of motion.  Neurological: He is alert.  Skin: Skin is warm and dry.  Psychiatric: He has a normal mood and affect. His behavior is normal.    ED Course  Procedures (including critical care time)  DIAGNOSTIC STUDIES: Oxygen Saturation is 99% on RA, normal by my interpretation.    COORDINATION OF CARE: 2:51 PM-Discussed COPD diagnosis with pt and treatment plan which includes  inhaler with pt at bedside and pt agreed to plan. [facility]  Smoking Cessation packet given to pt.  Labs Review Labs Reviewed - No data to display Imaging Review Dg Chest 2 View  12/13/2013   CLINICAL DATA:  Chronic cough for 1 year with brown mucous for 8 months with history of COPD  EXAM: CHEST  2 VIEW  COMPARISON:  07/03/2013  FINDINGS: Mild hyperinflation consistent with diagnosis of COPD. Heart size and vascular pattern are normal.  Lungs are clear. No pleural effusions.  IMPRESSION: No active cardiopulmonary disease.   Electronically Signed   By: Esperanza Heiraymond  Rubner M.D.   On: 12/13/2013 14:39    EKG Interpretation   None       MDM   1. Cough   2. COPD (chronic obstructive pulmonary disease)   3. Active smoker      I personally performed the services described in this documentation, which was scribed in my presence. The recorded information has been reviewed and is accurate.    Junius Finnerrin O'Malley, PA-C 12/13/13 1505

## 2013-12-13 NOTE — ED Provider Notes (Signed)
Medical screening examination/treatment/procedure(s) were performed by non-physician practitioner and as supervising physician I was immediately available for consultation/collaboration.  EKG Interpretation   None         William Andrian Sabala, MD 12/13/13 1553 

## 2013-12-13 NOTE — ED Notes (Signed)
Per pt sts he has been a smoker for 21 years and for about 1 year when he smoke he gets SOB and coughs up brown stuff. sts hard for him to expand his lungs and get a deep breath

## 2013-12-13 NOTE — ED Notes (Signed)
Pt states he has been having sob, brown colored phlegm, after smoking for about a year. Pt states he has been a smoker for 3026yrs. Pt has not tried to stop smoking. Pt states he rolls his own cigarettes. Pt does not have a pcp, and doesn't remember the last time he went to a doctor.

## 2013-12-13 NOTE — ED Notes (Signed)
Discharge and follow up instructions reviewed with pt. Pt verbalized understanding.  

## 2014-07-10 ENCOUNTER — Encounter (HOSPITAL_COMMUNITY): Payer: Self-pay | Admitting: Emergency Medicine

## 2014-07-10 ENCOUNTER — Emergency Department (HOSPITAL_COMMUNITY)
Admission: EM | Admit: 2014-07-10 | Discharge: 2014-07-10 | Disposition: A | Discharge status: Home / Self Care | Payer: Self-pay | Attending: Emergency Medicine | Admitting: Emergency Medicine

## 2014-07-10 ENCOUNTER — Emergency Department (HOSPITAL_COMMUNITY): Payer: Self-pay

## 2014-07-10 DIAGNOSIS — Y9389 Activity, other specified: Secondary | ICD-10-CM | POA: Insufficient documentation

## 2014-07-10 DIAGNOSIS — S91109A Unspecified open wound of unspecified toe(s) without damage to nail, initial encounter: Secondary | ICD-10-CM | POA: Insufficient documentation

## 2014-07-10 DIAGNOSIS — Y9289 Other specified places as the place of occurrence of the external cause: Secondary | ICD-10-CM | POA: Insufficient documentation

## 2014-07-10 DIAGNOSIS — S90121A Contusion of right lesser toe(s) without damage to nail, initial encounter: Secondary | ICD-10-CM

## 2014-07-10 DIAGNOSIS — S90129A Contusion of unspecified lesser toe(s) without damage to nail, initial encounter: Secondary | ICD-10-CM | POA: Insufficient documentation

## 2014-07-10 DIAGNOSIS — Z79899 Other long term (current) drug therapy: Secondary | ICD-10-CM | POA: Insufficient documentation

## 2014-07-10 DIAGNOSIS — S8990XA Unspecified injury of unspecified lower leg, initial encounter: Secondary | ICD-10-CM | POA: Insufficient documentation

## 2014-07-10 DIAGNOSIS — S99929A Unspecified injury of unspecified foot, initial encounter: Secondary | ICD-10-CM

## 2014-07-10 DIAGNOSIS — Z880 Allergy status to penicillin: Secondary | ICD-10-CM | POA: Insufficient documentation

## 2014-07-10 DIAGNOSIS — S99919A Unspecified injury of unspecified ankle, initial encounter: Secondary | ICD-10-CM

## 2014-07-10 DIAGNOSIS — Z8659 Personal history of other mental and behavioral disorders: Secondary | ICD-10-CM | POA: Insufficient documentation

## 2014-07-10 DIAGNOSIS — IMO0002 Reserved for concepts with insufficient information to code with codable children: Secondary | ICD-10-CM | POA: Insufficient documentation

## 2014-07-10 DIAGNOSIS — F17200 Nicotine dependence, unspecified, uncomplicated: Secondary | ICD-10-CM | POA: Insufficient documentation

## 2014-07-10 DIAGNOSIS — Z87820 Personal history of traumatic brain injury: Secondary | ICD-10-CM | POA: Insufficient documentation

## 2014-07-10 DIAGNOSIS — S91311A Laceration without foreign body, right foot, initial encounter: Secondary | ICD-10-CM

## 2014-07-10 MED ORDER — CEPHALEXIN 500 MG PO CAPS: 500.0000 mg | ORAL_CAPSULE | Freq: Three times a day (TID) | ORAL | Status: DC

## 2014-07-10 MED ORDER — IBUPROFEN 800 MG PO TABS: 800.0000 mg | ORAL_TABLET | Freq: Three times a day (TID) | ORAL | Status: DC | PRN

## 2014-07-10 MED ORDER — HYDROCODONE-ACETAMINOPHEN 5-325 MG PO TABS: 1.0000 | ORAL_TABLET | ORAL | Status: DC | PRN

## 2014-07-10 MED ORDER — HYDROCODONE-ACETAMINOPHEN 5-325 MG PO TABS
1.0000 | ORAL_TABLET | Freq: Once | ORAL | Status: AC
  Administered 2014-07-10: 1 via ORAL
  Filled 2014-07-10: qty 1

## 2014-07-10 NOTE — ED Provider Notes (Signed)
CSN: [id number]     Arrival date & time 07/10/14  1602 History  This chart was scribed for non-physician practitioner, Trixie DredgeEmily Zinia Innocent, PA-C working with Flint MelterElliott L Wentz, MD by Greggory StallionKayla Andersen, ED scribe. This patient was seen in room TR11C/TR11C and the patient's care was started at 4:32 PM.   Chief Complaint  Patient presents with  . Toe Injury   The history is provided by the patient. No language interpreter was used.   HPI Comments: Kristopher Kim is a 28 y.o. male who presents to the Emergency Department complaining of fourth and fifth toe injury that occurred this morning. States a string from his couch got wrapped around his toes so he kicked his foot out and kicked a Licensed conveyancercabinet door.  He has pain in his 4th and 5th toes that is constant, worse with ambulation, throbbing.  Also has a laceration to the bottom of his toes. The injury occurred at 5am.  He has cleaned the wound and put a bandaid on it but has done no other treatment. Pain is moderate to severe.  Tetanus within the past 3 years.   Past Medical History  Diagnosis Date  . Traumatic brain injury   . Bipolar 1 disorder    History reviewed. No pertinent past surgical history. History reviewed. No pertinent family history. History  Substance Use Topics  . Smoking status: Current Every Day Smoker -- 1.00 packs/day    Types: Cigarettes  . Smokeless tobacco: Never Used  . Alcohol Use: Yes     Comment: Occcasional    Review of Systems  Musculoskeletal: Positive for arthralgias and joint swelling.  Skin: Positive for wound.  All other systems reviewed and are negative.  Allergies  Ceclor and Penicillins  Home Medications   Prior to Admission medications   Medication Sig Start Date End Date Taking? Authorizing Provider  famotidine (PEPCID) 20 MG tablet Take 1 tablet (20 mg total) by mouth 2 (two) times daily. 08/12/13   Arman FilterGail K Schulz, NP   BP 115/60  Pulse 75  Temp(Src) 98.5 F (36.9 C) (Oral)  Resp 18  SpO2 98%  Physical  Exam  Nursing note and vitals reviewed. Constitutional: He appears well-developed and well-nourished. No distress.  HENT:  Head: Normocephalic and atraumatic.  Neck: Neck supple.  Pulmonary/Chest: Effort normal.  Musculoskeletal:       Feet:  Small laceration at crease under 5th toe of right foot.  Hemostatic.  Diffuse tenderness of 4th and 5th toes.  No other bony tenderness.  Sensation intact, capillary refill < 2 seconds.    Neurological: He is alert.  Skin: He is not diaphoretic.    ED Course  Procedures (including critical care time)  DIAGNOSTIC STUDIES: Oxygen Saturation is 98% on RA, normal by my interpretation.    COORDINATION OF CARE:   Labs Review Labs Reviewed - No data to display  Imaging Review Dg Foot Complete Right  07/10/2014   CLINICAL DATA:  Recent traumatic injury with fourth and fifth toe pain  EXAM: RIGHT FOOT COMPLETE - 3+ VIEW  COMPARISON:  05/25/2013  FINDINGS: There is no evidence of fracture or dislocation. There is no evidence of arthropathy or other focal bone abnormality. Soft tissues are unremarkable.  IMPRESSION: No acute abnormality noted.   Electronically Signed   By: Alcide CleverMark  Lukens M.D.   On: 07/10/2014 18:01     EKG Interpretation None      MDM   Final diagnoses:  Toe contusion, right, initial encounter  Foot laceration,  right, initial encounter    Patient with injury to right 4th and 5th toe 11-12 hours prior to being seen. Laceration probably could have used one suture but given extended period of time since injury, will not suture.  Xray negative for fracture.  Neurovascularly intact.  Wound cleaned and dressed in ED, post op shoe, crutches.  D/C home with keflex, norco, motrin.  Discussed result, findings, treatment, and follow up  with patient.  Pt given return precautions.  Pt verbalizes understanding and agrees with plan.      I personally performed the services described in this documentation, which was scribed in my presence. The  recorded information has been reviewed and is accurate.  Trixie Dredge, PA-C 07/10/14 [phone]

## 2014-07-10 NOTE — Discharge Instructions (Signed)
Read the information below.  Use the prescribed medication as directed.  Please discuss all new medications with your pharmacist.  Do not take additional tylenol while taking the prescribed pain medication to avoid overdose.  You may return to the Emergency Department at any time for worsening condition or any new symptoms that concern you.  If you develop redness, swelling, pus draining from the wound, or fevers greater than 100.4, return to the ER immediately for a recheck.     Contusion A contusion is a deep bruise. Contusions are the result of an injury that caused bleeding under the skin. The contusion may turn blue, purple, or yellow. Minor injuries will give you a painless contusion, but more severe contusions may stay painful and swollen for a few weeks.  CAUSES  A contusion is usually caused by a blow, trauma, or direct force to an area of the body. SYMPTOMS   Swelling and redness of the injured area.  Bruising of the injured area.  Tenderness and soreness of the injured area.  Pain. DIAGNOSIS  The diagnosis can be made by taking a history and physical exam. An X-ray, CT scan, or MRI may be needed to determine if there were any associated injuries, such as fractures. TREATMENT  Specific treatment will depend on what area of the body was injured. In general, the best treatment for a contusion is resting, icing, elevating, and applying cold compresses to the injured area. Over-the-counter medicines may also be recommended for pain control. Ask your caregiver what the best treatment is for your contusion. HOME CARE INSTRUCTIONS   Put ice on the injured area.  Put ice in a plastic bag.  Place a towel between your skin and the bag.  Leave the ice on for 15-20 minutes, 3-4 times a day, or as directed by your health care provider.  Only take over-the-counter or prescription medicines for pain, discomfort, or fever as directed by your caregiver. Your caregiver may recommend avoiding  anti-inflammatory medicines (aspirin, ibuprofen, and naproxen) for 48 hours because these medicines may increase bruising.  Rest the injured area.  If possible, elevate the injured area to reduce swelling. SEEK IMMEDIATE MEDICAL CARE IF:   You have increased bruising or swelling.  You have pain that is getting worse.  Your swelling or pain is not relieved with medicines. MAKE SURE YOU:   Understand these instructions.  Will watch your condition.  Will get help right away if you are not doing well or get worse. Document Released: 09/06/2005 Document Revised: 12/02/2013 Document Reviewed: 10/02/2011 [facility] Patient Information 2015 [place]. This information is not intended to replace advice given to you by your health care provider. Make sure you discuss any questions you have with your health care provider.  Laceration Care, Adult A laceration is a cut that goes through all layers of the skin. The cut goes into the tissue beneath the skin. HOME CARE For stitches (sutures) or staples:  Keep the cut clean and dry.  If you have a bandage (dressing), change it at least once a day. Change the bandage if it gets wet or dirty, or as told by your doctor.  Wash the cut with soap and water 2 times a day. Rinse the cut with water. Pat it dry with a clean towel.  Put a thin layer of medicated cream on the cut as told by your doctor.  You may shower after the first 24 hours. Do not soak the cut in water until the stitches  are removed.  Only take medicines as told by your doctor.  Have your stitches or staples removed as told by your doctor. For skin adhesive strips:  Keep the cut clean and dry.  Do not get the strips wet. You may take a bath, but be careful to keep the cut dry.  If the cut gets wet, pat it dry with a clean towel.  The strips will fall off on their own. Do not remove the strips that are still stuck to the cut. For wound glue:  You may shower or take  baths. Do not soak or scrub the cut. Do not swim. Avoid heavy sweating until the glue falls off on its own. After a shower or bath, pat the cut dry with a clean towel.  Do not put medicine on your cut until the glue falls off.  If you have a bandage, do not put tape over the glue.  Avoid lots of sunlight or tanning lamps until the glue falls off. Put sunscreen on the cut for the first year to reduce your scar.  The glue will fall off on its own. Do not pick at the glue. You may need a tetanus shot if:  You cannot remember when you had your last tetanus shot.  You have never had a tetanus shot. If you need a tetanus shot and you choose not to have one, you may get tetanus. Sickness from tetanus can be serious. GET HELP RIGHT AWAY IF:   Your pain does not get better with medicine.  Your arm, hand, leg, or foot loses feeling (numbness) or changes color.  Your cut is bleeding.  Your joint feels weak, or you cannot use your joint.  You have painful lumps on your body.  Your cut is red, puffy (swollen), or painful.  You have a red line on the skin near the cut.  You have yellowish-white fluid (pus) coming from the cut.  You have a fever.  You have a bad smell coming from the cut or bandage.  Your cut breaks open before or after stitches are removed.  You notice something coming out of the cut, such as wood or glass.  You cannot move a finger or toe. MAKE SURE YOU:   Understand these instructions.  Will watch your condition.  Will get help right away if you are not doing well or get worse. Document Released: 05/15/2008 Document Revised: 02/19/2012 Document Reviewed: 05/23/2011 [facility] Patient Information 2015 [place], [place]. This information is not intended to replace advice given to you by your health care provider. Make sure you discuss any questions you have with your health care provider.
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
## 2014-07-11 NOTE — ED Provider Notes (Signed)
Medical screening examination/treatment/procedure(s) were performed by non-physician practitioner and as supervising physician I was immediately available for consultation/collaboration.  Flint MelterElliott L Demarrion Meiklejohn, MD 07/11/14 [phone]

## 2014-10-27 ENCOUNTER — Encounter (HOSPITAL_BASED_OUTPATIENT_CLINIC_OR_DEPARTMENT_OTHER): Payer: Self-pay | Admitting: *Deleted

## 2014-10-27 ENCOUNTER — Emergency Department (HOSPITAL_BASED_OUTPATIENT_CLINIC_OR_DEPARTMENT_OTHER): Payer: Self-pay

## 2014-10-27 ENCOUNTER — Emergency Department (HOSPITAL_BASED_OUTPATIENT_CLINIC_OR_DEPARTMENT_OTHER)
Admission: EM | Admit: 2014-10-27 | Discharge: 2014-10-27 | Disposition: A | Discharge status: Home / Self Care | Payer: Self-pay | Attending: Emergency Medicine | Admitting: Emergency Medicine

## 2014-10-27 DIAGNOSIS — Z792 Long term (current) use of antibiotics: Secondary | ICD-10-CM | POA: Insufficient documentation

## 2014-10-27 DIAGNOSIS — Y9389 Activity, other specified: Secondary | ICD-10-CM | POA: Insufficient documentation

## 2014-10-27 DIAGNOSIS — Y9301 Activity, walking, marching and hiking: Secondary | ICD-10-CM | POA: Insufficient documentation

## 2014-10-27 DIAGNOSIS — Z87820 Personal history of traumatic brain injury: Secondary | ICD-10-CM | POA: Insufficient documentation

## 2014-10-27 DIAGNOSIS — Z880 Allergy status to penicillin: Secondary | ICD-10-CM | POA: Insufficient documentation

## 2014-10-27 DIAGNOSIS — Y998 Other external cause status: Secondary | ICD-10-CM | POA: Insufficient documentation

## 2014-10-27 DIAGNOSIS — S52612A Displaced fracture of left ulna styloid process, initial encounter for closed fracture: Secondary | ICD-10-CM | POA: Insufficient documentation

## 2014-10-27 DIAGNOSIS — Z720 Tobacco use: Secondary | ICD-10-CM | POA: Insufficient documentation

## 2014-10-27 DIAGNOSIS — Y92410 Unspecified street and highway as the place of occurrence of the external cause: Secondary | ICD-10-CM | POA: Insufficient documentation

## 2014-10-27 DIAGNOSIS — T1490XA Injury, unspecified, initial encounter: Secondary | ICD-10-CM

## 2014-10-27 DIAGNOSIS — Z8659 Personal history of other mental and behavioral disorders: Secondary | ICD-10-CM | POA: Insufficient documentation

## 2014-10-27 DIAGNOSIS — R52 Pain, unspecified: Secondary | ICD-10-CM

## 2014-10-27 MED ORDER — OXYCODONE-ACETAMINOPHEN 5-325 MG PO TABS: 1.0000 | ORAL_TABLET | ORAL | Status: DC | PRN
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
## 2014-10-27 NOTE — Discharge Instructions (Signed)
Wrist Fracture A wrist fracture is a break or crack in one of the bones of your wrist. Your wrist is made up of eight small bones at the palm of your hand (carpal bones) and two long bones that make up your forearm (radius and ulna).  CAUSES   A direct blow to the wrist.  Falling on an outstretched hand.  Trauma, such as a car accident or a fall. RISK FACTORS Risk factors for wrist fracture include:   Participating in contact and high-risk sports, such as skiing, biking, and ice skating.  Taking steroid medicines.  Smoking.  Being male.  Being Caucasian.  Drinking more than three alcoholic beverages per day.  Having low or lowered bone density (osteoporosis or osteopenia).  Age. Older adults have decreased bone density.  Women who have had menopause.  History of previous fractures. SIGNS AND SYMPTOMS Symptoms of wrist fractures include tenderness, bruising, and inflammation. Additionally, the wrist may hang in an odd position or appear deformed.  DIAGNOSIS Diagnosis may include:  Physical exam.  X-ray. TREATMENT Treatment depends on many factors, including the nature and location of the fracture, your age, and your activity level. Treatment for wrist fracture can be nonsurgical or surgical.  Nonsurgical Treatment A plaster cast or splint may be applied to your wrist if the bone is in a good position. If the fracture is not in good position, it may be necessary for your health care provider to realign it before applying a splint or cast. Usually, a cast or splint will be worn for several weeks.  Surgical Treatment Sometimes the position of the bone is so far out of place that surgery is required to apply a device to hold it together as it heals. Depending on the fracture, there are a number of options for holding the bone in place while it heals, such as a cast and metal pins.  HOME CARE INSTRUCTIONS  Keep your injured wrist elevated and move your fingers as much as  possible.  Do not put pressure on any part of your cast or splint. It may break.   Use a plastic bag to protect your cast or splint from water while bathing or showering. Do not lower your cast or splint into water.  Take medicines only as directed by your health care provider.  Keep your cast or splint clean and dry. If it becomes wet, damaged, or suddenly feels too tight, contact your health care provider right away.  Do not use any tobacco products including cigarettes, chewing tobacco, or electronic cigarettes. Tobacco can delay bone healing. If you need help quitting, ask your health care provider.  Keep all follow-up visits as directed by your health care provider. This is important.  Ask your health care provider if you should take supplements of calcium and vitamins C and D to promote bone healing. SEEK MEDICAL CARE IF:   Your cast or splint is damaged, breaks, or gets wet.  You have a fever.  You have chills.  You have continued severe pain or more swelling than you did before the cast was put on. SEEK IMMEDIATE MEDICAL CARE IF:   Your hand or fingernails on the injured arm turn blue or gray, or feel cold or numb.  You have decreased feeling in the fingers of your injured arm. MAKE SURE YOU:  Understand these instructions.  Will watch your condition.  Will get help right away if you are not doing well or get worse. Document Released: 09/06/2005 Document Revised:   04/13/2014 Document Reviewed: 12/15/2011 [employer] Patient Information 2015 [employer]. This information is not intended to replace advice given to you by your health care provider. Make sure you discuss any questions you have with your health care provider.  
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
## 2016-11-06 ENCOUNTER — Encounter (HOSPITAL_COMMUNITY): Payer: Self-pay | Admitting: *Deleted

## 2016-11-06 ENCOUNTER — Emergency Department (HOSPITAL_COMMUNITY)
Admission: EM | Admit: 2016-11-06 | Discharge: 2016-11-06 | Disposition: A | Discharge status: Home / Self Care | Payer: Self-pay | Attending: Emergency Medicine | Admitting: Emergency Medicine

## 2016-11-06 DIAGNOSIS — S0502XA Injury of conjunctiva and corneal abrasion without foreign body, left eye, initial encounter: Secondary | ICD-10-CM | POA: Insufficient documentation

## 2016-11-06 DIAGNOSIS — Y939 Activity, unspecified: Secondary | ICD-10-CM | POA: Insufficient documentation

## 2016-11-06 DIAGNOSIS — X58XXXA Exposure to other specified factors, initial encounter: Secondary | ICD-10-CM | POA: Insufficient documentation

## 2016-11-06 DIAGNOSIS — Y929 Unspecified place or not applicable: Secondary | ICD-10-CM | POA: Insufficient documentation

## 2016-11-06 DIAGNOSIS — Y999 Unspecified external cause status: Secondary | ICD-10-CM | POA: Insufficient documentation

## 2016-11-06 DIAGNOSIS — F17210 Nicotine dependence, cigarettes, uncomplicated: Secondary | ICD-10-CM | POA: Insufficient documentation

## 2016-11-06 MED ORDER — BACITRACIN-POLYMYXIN B 500-10000 UNIT/GM OP OINT: 1.0000 "application " | TOPICAL_OINTMENT | Freq: Three times a day (TID) | OPHTHALMIC | 0 refills | Status: DC

## 2016-11-06 MED ORDER — TETRACAINE HCL 0.5 % OP SOLN
1.0000 [drp] | Freq: Once | OPHTHALMIC | Status: AC
  Administered 2016-11-06: 1 [drp] via OPHTHALMIC
  Filled 2016-11-06: qty 2

## 2016-11-06 MED ORDER — FLUORESCEIN SODIUM 1 MG OP STRP
1.0000 | ORAL_STRIP | Freq: Once | OPHTHALMIC | Status: AC
  Administered 2016-11-06: 1 via OPHTHALMIC
  Filled 2016-11-06: qty 1

## 2016-11-06 MED ORDER — IBUPROFEN 400 MG PO TABS
400.0000 mg | ORAL_TABLET | Freq: Once | ORAL | Status: AC
  Administered 2016-11-06: 400 mg via ORAL
  Filled 2016-11-06: qty 1

## 2016-11-06 NOTE — ED Triage Notes (Signed)
Pt with pink, irritated, swollen eye.

## 2016-11-06 NOTE — ED Provider Notes (Signed)
[facility]-EMERGENCY DEPT Provider Note   CSN: [id number] Arrival date & time: 11/06/16  1015  By signing my name below, I, Clovis PuAvnee Patel, attest that this documentation has been prepared under the direction and in the presence of Raeford RazorStephen Houa Nie, MD  Electronically Signed: Clovis PuAvnee Patel, ED Scribe. 11/06/16. 12:28 PM.   History   Chief Complaint Chief Complaint  Patient presents with  . Eye Pain    The history is provided by the patient. No language interpreter was used.   HPI Comments:  Kristopher Kim is a 30 y.o. male who presents to the Emergency Department complaining of sudden onset, intermittent eye irritation/pain which began in the AM yesterday. Pt notes associated eye redness and swelling. He states he feels like something is scratching his eye. 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 Pt denies visual disturbances, any other associated symptoms and modifying factors at this time.    Past Medical History:  Diagnosis Date  . Bipolar 1 disorder (HCC)   . Traumatic brain injury (HCC)     There are no active problems to display for this patient.   History reviewed. No pertinent surgical history.     Home Medications    Prior to Admission medications   Medication Sig Start Date End Date Taking? Authorizing Provider  cephALEXin (KEFLEX) 500 MG capsule Take 1 capsule (500 mg total) by mouth 3 (three) times daily. 07/10/14   Trixie DredgeEmily West, PA-C  HYDROcodone-acetaminophen (NORCO/VICODIN) 5-325 MG per tablet Take 1 tablet by mouth every 4 (four) hours as needed for moderate pain or severe pain. 07/10/14   Trixie DredgeEmily West, PA-C  ibuprofen (ADVIL,MOTRIN) 800 MG tablet Take 1 tablet (800 mg total) by mouth every 8 (eight) hours as needed for mild pain or moderate pain. 07/10/14   Trixie DredgeEmily West, PA-C  oxyCODONE-acetaminophen (PERCOCET/ROXICET) 5-325 MG per tablet Take 1-2 tablets by mouth every 4 (four) hours as needed for severe pain. 10/27/14   Mirian MoMatthew Gentry, MD    Family History No family  history on file.  Social History Social History  Substance Use Topics  . Smoking status: Current Every Day Smoker    Packs/day: 0.50    Types: Cigarettes  . Smokeless tobacco: Never Used  . Alcohol use No     Comment: quit 2016     Allergies   Ceclor [cefaclor] and Penicillins   Review of Systems Review of Systems  Eyes: Positive for photophobia, pain, redness and itching. Negative for visual disturbance.  Skin: Negative for wound.  All other systems reviewed and are negative.    Physical Exam Updated Vital Signs BP 110/82 (BP Location: Left Arm)   Pulse (!) 58   Temp 98.5 F (36.9 C) (Oral)   Resp 16   Ht 6\' 1"  (1.854 m)   Wt 175 lb (79.4 kg)   SpO2 100%   BMI 23.09 kg/m   Physical Exam  Constitutional: He is oriented to person, place, and time. He appears well-developed and well-nourished. No distress.  HENT:  Head: Normocephalic and atraumatic.  Eyes:  Left eye puffy. Mildly erythematous. Left conjunctiva is injected. Small cornea abrasion L cornea around 5-6 o'clock. Lids everted. No fb noted.   Cardiovascular: Normal rate.   Pulmonary/Chest: Effort normal.  Abdominal: He exhibits no distension.  Neurological: He is alert and oriented to person, place, and time.  Skin: Skin is warm and dry.  Psychiatric: He has a normal mood and affect.  Nursing note and vitals reviewed.    ED Treatments / Results  DIAGNOSTIC STUDIES:  Oxygen Saturation is 100% on RA, normal by my interpretation.    COORDINATION OF CARE:  12:25 PM Discussed treatment plan with pt at bedside and pt agreed to plan.  Labs (all labs ordered are listed, but only abnormal results are displayed) Labs Reviewed - No data to display  EKG  EKG Interpretation None       Radiology No results found.  Procedures Procedures (including critical care time)  Medications Ordered in ED Medications - No data to display   Initial Impression / Assessment and Plan / ED Course  I have  reviewed the triage vital signs and the nursing notes.  Pertinent labs & imaging results that were available during my care of the patient were reviewed by me and considered in my medical decision making (see chart for details).  Clinical Course     Final Clinical Impressions(s) / ED Diagnoses   Final diagnoses:  Abrasion of left cornea, initial encounter    New Prescriptions New Prescriptions   No medications on file    I personally preformed the services scribed in my presence. The recorded information has been reviewed is accurate. Raeford RazorStephen Kaine Mcquillen, MD.     Raeford RazorStephen Lita Flynn, MD 11/14/16 [phone]

## 2017-05-10 NOTE — Congregational Nurse Program (Signed)
Congregational Nurse Program Note  Date of Encounter: 05/07/2017  Past Medical History: No past medical history on file.  Encounter Details:     CNP Questionnaire - 05/07/17 1829      Patient Demographics   Is this a new or existing patient? New   Patient is considered a/an Not Applicable   Race African-American/Black     Patient Assistance   Location of Patient Assistance Not Applicable   Patient's financial/insurance status Low Income;Self-Pay (Uninsured)   Uninsured Patient ([employer] Research officer, trade unionCard/Care Connects) No   Patient referred to apply for the following financial assistance [employer]/Care Connects   Food insecurities addressed Not Technical brewerApplicable   Transportation assistance No   Assistance securing medications No   Educational health offerings Acute disease;Navigating the healthcare system     Encounter Details   Primary purpose of visit Acute Illness/Condition Visit;Navigating the Healthcare System   Was an Emergency Department visit averted? Not Applicable   Does patient have a medical provider? Yes   Patient referred to Not Applicable   Was a mental health screening completed? (GAINS tool) No   Does patient have dental issues? No   Does patient have vision issues? No   Does your patient have an abnormal blood pressure today? No   Since previous encounter, have you referred patient for abnormal blood pressure that resulted in a new diagnosis or medication change? No   Does your patient have an abnormal blood glucose today? No   Since previous encounter, have you referred patient for abnormal blood glucose that resulted in a new diagnosis or medication change? No   Was there a life-saving intervention made? No     Requested referral for dental clinic.  Informed client he must have an orange card.  Assisted client with the application.  Instructed client that once he has the orange card, he needs to return with his orange card to begin there referral process

## 2017-06-19 ENCOUNTER — Encounter (HOSPITAL_COMMUNITY): Payer: Self-pay | Admitting: Emergency Medicine

## 2017-06-19 ENCOUNTER — Emergency Department (HOSPITAL_COMMUNITY)
Admission: EM | Admit: 2017-06-19 | Discharge: 2017-06-20 | Disposition: A | Discharge status: Home / Self Care | Payer: Self-pay | Attending: Emergency Medicine | Admitting: Emergency Medicine

## 2017-06-19 DIAGNOSIS — R0602 Shortness of breath: Secondary | ICD-10-CM | POA: Insufficient documentation

## 2017-06-19 DIAGNOSIS — J449 Chronic obstructive pulmonary disease, unspecified: Secondary | ICD-10-CM | POA: Insufficient documentation

## 2017-06-19 DIAGNOSIS — T675XXA Heat exhaustion, unspecified, initial encounter: Secondary | ICD-10-CM | POA: Insufficient documentation

## 2017-06-19 DIAGNOSIS — N289 Disorder of kidney and ureter, unspecified: Secondary | ICD-10-CM | POA: Insufficient documentation

## 2017-06-19 DIAGNOSIS — M791 Myalgia: Secondary | ICD-10-CM | POA: Insufficient documentation

## 2017-06-19 DIAGNOSIS — F17210 Nicotine dependence, cigarettes, uncomplicated: Secondary | ICD-10-CM | POA: Insufficient documentation

## 2017-06-19 HISTORY — DX: Chronic obstructive pulmonary disease, unspecified: J44.9

## 2017-06-19 LAB — CBC WITH DIFFERENTIAL/PLATELET
BASOS ABS: 0 10*3/uL (ref 0.0–0.1)
Basophils Relative: 0 %
EOS PCT: 1 %
Eosinophils Absolute: 0.1 10*3/uL (ref 0.0–0.7)
HCT: 45 % (ref 39.0–52.0)
Hemoglobin: 16.8 g/dL (ref 13.0–17.0)
LYMPHS ABS: 2.3 10*3/uL (ref 0.7–4.0)
Lymphocytes Relative: 16 %
MCH: 34.2 pg — AB (ref 26.0–34.0)
MCHC: 37.3 g/dL — AB (ref 30.0–36.0)
MCV: 91.6 fL (ref 78.0–100.0)
MONOS PCT: 7 %
Monocytes Absolute: 1 10*3/uL (ref 0.1–1.0)
NEUTROS ABS: 10.7 10*3/uL — AB (ref 1.7–7.7)
Neutrophils Relative %: 76 %
PLATELETS: 194 10*3/uL (ref 150–400)
RBC: 4.91 MIL/uL (ref 4.22–5.81)
RDW: 12.5 % (ref 11.5–15.5)
WBC: 14.2 10*3/uL — ABNORMAL HIGH (ref 4.0–10.5)

## 2017-06-19 LAB — I-STAT CHEM 8, ED
BUN: 16 mg/dL (ref 6–20)
CREATININE: 2 mg/dL — AB (ref 0.61–1.24)
Calcium, Ion: 0.93 mmol/L — ABNORMAL LOW (ref 1.15–1.40)
Chloride: 100 mmol/L — ABNORMAL LOW (ref 101–111)
GLUCOSE: 91 mg/dL (ref 65–99)
HCT: 49 % (ref 39.0–52.0)
HEMOGLOBIN: 16.7 g/dL (ref 13.0–17.0)
POTASSIUM: 4 mmol/L (ref 3.5–5.1)
Sodium: 136 mmol/L (ref 135–145)
TCO2: 26 mmol/L (ref 0–100)

## 2017-06-19 LAB — BASIC METABOLIC PANEL
Anion gap: 12 (ref 5–15)
BUN: 15 mg/dL (ref 6–20)
CHLORIDE: 100 mmol/L — AB (ref 101–111)
CO2: 24 mmol/L (ref 22–32)
Calcium: 10 mg/dL (ref 8.9–10.3)
Creatinine, Ser: 1.92 mg/dL — ABNORMAL HIGH (ref 0.61–1.24)
GFR calc Af Amer: 52 mL/min — ABNORMAL LOW (ref >60)
GFR, EST NON AFRICAN AMERICAN: 45 mL/min — AB (ref >60)
Glucose, Bld: 98 mg/dL (ref 65–99)
Potassium: 4.2 mmol/L (ref 3.5–5.1)
Sodium: 136 mmol/L (ref 135–145)

## 2017-06-19 MED ORDER — SODIUM CHLORIDE 0.9 % IV BOLUS (SEPSIS)
1000.0000 mL | Freq: Once | INTRAVENOUS | Status: AC
  Administered 2017-06-19: 1000 mL via INTRAVENOUS

## 2017-06-19 NOTE — ED Provider Notes (Signed)
[facility]-EMERGENCY DEPT Provider Note   CSN: [id number] Arrival date & time: 06/19/17  2022     History   Chief Complaint Chief Complaint  Patient presents with  . Heat Exposure    HPI Kristopher Kim is a 31 y.o. male.  Patient is a 31 year old male with a history of bipolar disorder, COPD and traumatic brain injury who presents with muscle cramps. He states he was working out in the heat all day. He was drinking fluids but when he went back to the place where he sleeps which is outside, he started noticing some muscle cramps. He had intense cramps to his lower extremities and his hands. He felt a little short of breath at the time. EMS was called. He got about 500 cc of normal saline per EMS. He states his cramping has improved. He no longer has any shortness of breath. No chest pain. No headache or dizziness. No fevers. No other recent illnesses. No numbness or weakness to the extremities.      Past Medical History:  Diagnosis Date  . Bipolar 1 disorder (HCC)   . COPD (chronic obstructive pulmonary disease) (HCC)   . Traumatic brain injury (HCC)     There are no active problems to display for this patient.   History reviewed. No pertinent surgical history.     Home Medications    Prior to Admission medications   Medication Sig Start Date End Date Taking? Authorizing Provider  bacitracin-polymyxin b (POLYSPORIN) ophthalmic ointment Place 1 application into the left eye 3 (three) times daily. Patient not taking: Reported on 06/19/2017 11/06/16   Raeford RazorKohut, Stephen, MD  cephALEXin (KEFLEX) 500 MG capsule Take 1 capsule (500 mg total) by mouth 3 (three) times daily. Patient not taking: Reported on 06/19/2017 07/10/14   Trixie DredgeWest, Emily, PA-C  HYDROcodone-acetaminophen (NORCO/VICODIN) 5-325 MG per tablet Take 1 tablet by mouth every 4 (four) hours as needed for moderate pain or severe pain. Patient not taking: Reported on 06/19/2017 07/10/14   Trixie DredgeWest, Emily, PA-C  ibuprofen (ADVIL,MOTRIN)  800 MG tablet Take 1 tablet (800 mg total) by mouth every 8 (eight) hours as needed for mild pain or moderate pain. Patient not taking: Reported on 06/19/2017 07/10/14   Trixie DredgeWest, Emily, PA-C  oxyCODONE-acetaminophen (PERCOCET/ROXICET) 5-325 MG per tablet Take 1-2 tablets by mouth every 4 (four) hours as needed for severe pain. Patient not taking: Reported on 06/19/2017 10/27/14   Mirian MoGentry, Matthew, MD    Family History History reviewed. No pertinent family history.  Social History Social History  Substance Use Topics  . Smoking status: Current Every Day Smoker    Packs/day: 0.50    Types: Cigarettes  . Smokeless tobacco: Never Used  . Alcohol use No     Comment: quit 2016     Allergies   Ceclor [cefaclor] and Penicillins   Review of Systems Review of Systems  Constitutional: Positive for fatigue. Negative for chills, diaphoresis and fever.  HENT: Negative for congestion, rhinorrhea and sneezing.   Eyes: Negative.   Respiratory: Positive for shortness of breath. Negative for cough and chest tightness.   Cardiovascular: Negative for chest pain and leg swelling.  Gastrointestinal: Negative for abdominal pain, blood in stool, diarrhea, nausea and vomiting.  Genitourinary: Negative for difficulty urinating, flank pain, frequency and hematuria.  Musculoskeletal: Positive for myalgias. Negative for arthralgias and back pain.  Skin: Negative for rash.  Neurological: Negative for dizziness, speech difficulty, weakness, numbness and headaches.     Physical Exam Updated Vital Signs BP Marland Kitchen(!)  149/95 (BP Location: Right Arm)   Pulse (!) 54   Temp 98.1 F (36.7 C) (Oral)   Resp (!) 24   SpO2 98%   Physical Exam  Constitutional: He is oriented to person, place, and time. He appears well-developed and well-nourished.  HENT:  Head: Normocephalic and atraumatic.  Eyes: Pupils are equal, round, and reactive to light.  Neck: Normal range of motion. Neck supple.  Cardiovascular: Normal rate,  regular rhythm and normal heart sounds.   Pulmonary/Chest: Effort normal and breath sounds normal. No respiratory distress. He has no wheezes. He has no rales. He exhibits no tenderness.  Abdominal: Soft. Bowel sounds are normal. There is no tenderness. There is no rebound and no guarding.  Musculoskeletal: Normal range of motion. He exhibits no edema.  Lymphadenopathy:    He has no cervical adenopathy.  Neurological: He is alert and oriented to person, place, and time.  Skin: Skin is warm and dry. No rash noted.  Psychiatric: He has a normal mood and affect.     ED Treatments / Results  Labs (all labs ordered are listed, but only abnormal results are displayed) Labs Reviewed  BASIC METABOLIC PANEL - Abnormal; Notable for the following:       Result Value   Chloride 100 (*)    Creatinine, Ser 1.92 (*)    GFR calc non Af Amer 45 (*)    GFR calc Af Amer 52 (*)    All other components within normal limits  CBC WITH DIFFERENTIAL/PLATELET - Abnormal; Notable for the following:    WBC 14.2 (*)    MCH 34.2 (*)    MCHC 37.3 (*)    Neutro Abs 10.7 (*)    All other components within normal limits  I-STAT CHEM 8, ED - Abnormal; Notable for the following:    Chloride 100 (*)    Creatinine, Ser 2.00 (*)    Calcium, Ion 0.93 (*)    All other components within normal limits    EKG  EKG Interpretation None       Radiology No results found.  Procedures Procedures (including critical care time)  Medications Ordered in ED Medications  sodium chloride 0.9 % bolus 1,000 mL (1,000 mLs Intravenous New Bag/Given 06/19/17 2216)     Initial Impression / Assessment and Plan / ED Course  I have reviewed the triage vital signs and the nursing notes.  Pertinent labs & imaging results that were available during my care of the patient were reviewed by me and considered in my medical decision making (see chart for details).     Patient presents with muscle cramps after working out in the  heat all day. He has improved after IV fluids in the ED. His electrolytes are non-concerning. His creatinine is mildly elevated. I don't know what his baseline values are. I had added a CK onto his labs to assess for rhabdomyolysis the patient is unwilling to stay for further evaluation. I advised him that rather wait until we can check for this but he is not willing to stay any longer. I did encourage him that he will need to have his creatinine rechecked within the next 1-2 weeks. Return precautions were given. Symptomatic care instructions were given.  Final Clinical Impressions(s) / ED Diagnoses   Final diagnoses:  Heat exhaustion, initial encounter  Renal insufficiency    New Prescriptions New Prescriptions   No medications on file     Rolan Bucco, MD 06/19/17 2346

## 2017-06-19 NOTE — ED Triage Notes (Signed)
Patient BIB EMS for heat exposure. Patient was working outside all day without drinking fluids. Upon returning home, pt began having cramps and muscle spasms in bilateral arms and leg. Pt reported feeling SOB initally to EMS but o2 sat 97% on RA. Pt given 500 ml ns with EMS

## 2017-06-19 NOTE — ED Notes (Signed)
Bed: [id number] Expected date:  Expected time:  Means of arrival:  Comments: EMS SOB/muscle cramps

## 2017-07-28 ENCOUNTER — Emergency Department (HOSPITAL_COMMUNITY): Payer: Self-pay

## 2017-07-28 ENCOUNTER — Emergency Department (HOSPITAL_COMMUNITY)
Admission: EM | Admit: 2017-07-28 | Discharge: 2017-07-28 | Disposition: A | Discharge status: Home / Self Care | Payer: Self-pay | Attending: Emergency Medicine | Admitting: Emergency Medicine

## 2017-07-28 DIAGNOSIS — Y990 Civilian activity done for income or pay: Secondary | ICD-10-CM | POA: Insufficient documentation

## 2017-07-28 DIAGNOSIS — Y929 Unspecified place or not applicable: Secondary | ICD-10-CM | POA: Insufficient documentation

## 2017-07-28 DIAGNOSIS — Y939 Activity, unspecified: Secondary | ICD-10-CM | POA: Insufficient documentation

## 2017-07-28 DIAGNOSIS — S61011A Laceration without foreign body of right thumb without damage to nail, initial encounter: Secondary | ICD-10-CM | POA: Insufficient documentation

## 2017-07-28 DIAGNOSIS — J449 Chronic obstructive pulmonary disease, unspecified: Secondary | ICD-10-CM | POA: Insufficient documentation

## 2017-07-28 DIAGNOSIS — F17210 Nicotine dependence, cigarettes, uncomplicated: Secondary | ICD-10-CM | POA: Insufficient documentation

## 2017-07-28 DIAGNOSIS — W268XXA Contact with other sharp object(s), not elsewhere classified, initial encounter: Secondary | ICD-10-CM | POA: Insufficient documentation

## 2017-07-28 DIAGNOSIS — Z87820 Personal history of traumatic brain injury: Secondary | ICD-10-CM | POA: Insufficient documentation

## 2017-07-28 MED ORDER — LIDOCAINE HCL (PF) 1 % IJ SOLN
30.0000 mL | Freq: Once | INTRAMUSCULAR | Status: DC
  Filled 2017-07-28: qty 30

## 2017-07-28 MED ORDER — IBUPROFEN 800 MG PO TABS: 800.0000 mg | ORAL_TABLET | Freq: Three times a day (TID) | ORAL | Status: DC

## 2017-07-28 MED ORDER — ACETAMINOPHEN 500 MG PO TABS: 500.0000 mg | ORAL_TABLET | Freq: Four times a day (QID) | ORAL | Status: DC | PRN

## 2017-07-28 MED ORDER — SULFAMETHOXAZOLE-TRIMETHOPRIM 800-160 MG PO TABS: 1.0000 | ORAL_TABLET | Freq: Two times a day (BID) | ORAL | 0 refills | Status: DC

## 2017-07-28 MED ORDER — CIPROFLOXACIN HCL 0.3 % OP SOLN: 2.0000 [drp] | OPHTHALMIC | Status: DC

## 2017-07-28 NOTE — ED Notes (Signed)
MD at bedside for suture repair

## 2017-07-28 NOTE — Discharge Instructions (Signed)
Please follow-up with a physician for removal of your stitches within 8-10 days or sooner if needed. We have listed a provider above that may be available, if they are not, please call and schedule with a primary care doctor.   If you develop swelling, redness, severe uncontrollable pain, or formation of pus or any other concerning symptoms please return to the ED for follow-up.  Thank you for your visit to the [facility] ED.

## 2017-07-28 NOTE — ED Triage Notes (Signed)
Pt was at work at  [employer] AND A PAIR OF SHEERS SLIPPED AND SLICED HIS RIGHT THUMB. EMS gave of fentanyl. No LOC no pain. 132/90 99% RA, 16 RR, 50 HR.

## 2017-07-28 NOTE — ED Provider Notes (Signed)
[facility]-EMERGENCY DEPT Provider Note   CSN: [id number] Arrival date & time: 07/28/17  1721   History   Chief Complaint Chief Complaint  Patient presents with  . Laceration    HPI Kristopher Kim is a 31 y.o. male.  Patient presented with a severe, nearly severing injury of the tip of his right thumb. He has a past medical history significant for Bipolar disorder type 1, COPD, and previous traumatic brain injury. He states that while at work he slipped with a pair of heavy metal shears and dislodged the distal 1.5cm of his right thumb. There was perfuse bleeding before he wrapped the thumb with a handkerchief. From there he drove himself to the ED for assistance with reattachment of the tip of his thumb. In the ED he stated that while waiting for the doctor, his thumb stopped bleeding as profusely and was able to rest more comfortably. The thumb was tender near the tip, nail was intact but the laceration extended beneath the thumbnail.        Past Medical History:  Diagnosis Date  . Bipolar 1 disorder (HCC)   . COPD (chronic obstructive pulmonary disease) (HCC)   . Traumatic brain injury (HCC)     There are no active problems to display for this patient.   No past surgical history on file.     Home Medications    Prior to Admission medications   Medication Sig Start Date End Date Taking? Authorizing Provider  bacitracin-polymyxin b (POLYSPORIN) ophthalmic ointment Place 1 application into the left eye 3 (three) times daily. Patient not taking: Reported on 06/19/2017 11/06/16   Raeford Razor, MD  cephALEXin (KEFLEX) 500 MG capsule Take 1 capsule (500 mg total) by mouth 3 (three) times daily. Patient not taking: Reported on 06/19/2017 07/10/14   Trixie Dredge, PA-C  HYDROcodone-acetaminophen (NORCO/VICODIN) 5-325 MG per tablet Take 1 tablet by mouth every 4 (four) hours as needed for moderate pain or severe pain. Patient not taking: Reported on 06/19/2017 07/10/14   Trixie Dredge, PA-C    ibuprofen (ADVIL,MOTRIN) 800 MG tablet Take 1 tablet (800 mg total) by mouth every 8 (eight) hours as needed for mild pain or moderate pain. Patient not taking: Reported on 06/19/2017 07/10/14   Trixie Dredge, PA-C  oxyCODONE-acetaminophen (PERCOCET/ROXICET) 5-325 MG per tablet Take 1-2 tablets by mouth every 4 (four) hours as needed for severe pain. Patient not taking: Reported on 06/19/2017 10/27/14   Mirian Mo, MD  sulfamethoxazole-trimethoprim (BACTRIM DS,SEPTRA DS) 800-160 MG tablet Take 1 tablet by mouth 2 (two) times daily. 07/28/17   Lanelle Bal, MD    Family History No family history on file.  Social History Social History  Substance Use Topics  . Smoking status: Current Every Day Smoker    Packs/day: 0.50    Types: Cigarettes  . Smokeless tobacco: Never Used  . Alcohol use No     Comment: quit 2016     Allergies   Ceclor [cefaclor] and Penicillins   Review of Systems Review of Systems  Constitutional: Negative for chills and fever.  HENT: Negative for ear pain and sore throat.   Eyes: Negative for pain and visual disturbance.  Respiratory: Negative for cough and shortness of breath.   Cardiovascular: Negative for chest pain and palpitations.  Gastrointestinal: Negative for abdominal pain and vomiting.  Genitourinary: Negative for dysuria and hematuria.  Musculoskeletal: Negative for arthralgias and back pain.       Severe thumb pain following partial amputation with heavy metal shears.  Skin: Positive for wound (Right thumb). Negative for color change and rash.  Neurological: Negative for seizures and syncope.  Psychiatric/Behavioral: Negative for confusion. The patient is not nervous/anxious.   All other systems reviewed and are negative.    Physical Exam Updated Vital Signs BP (!) 133/91 (BP Location: Left Arm)   Pulse (!) 47   Temp 97.9 F (36.6 C) (Oral)   Resp 14   Ht 6\' 1"  (1.854 m)   Wt 77.1 kg (170 lb)   SpO2 98%   BMI 22.43 kg/m    Physical Exam  Constitutional: He is oriented to person, place, and time. He appears well-developed and well-nourished.  HENT:  Head: Normocephalic and atraumatic.  Eyes: Conjunctivae are normal.  Neck: Neck supple.  Cardiovascular: Normal rate and regular rhythm.   No murmur heard. Pulmonary/Chest: Effort normal and breath sounds normal. No respiratory distress.  Abdominal: Soft. There is no tenderness.  Musculoskeletal: Normal range of motion. He exhibits tenderness and deformity (Distal tip of the right thumb not envolving the nailbed or nail but running beneath the nail from radially to medially with less than 20% of the tissue remaining intact). He exhibits no edema.  Neurological: He is alert and oriented to person, place, and time.  Skin: Skin is warm and dry.  Psychiatric: He has a normal mood and affect.  Nursing note and vitals reviewed.    ED Treatments / Results  Labs (all labs ordered are listed, but only abnormal results are displayed) Labs Reviewed - No data to display  EKG  EKG Interpretation None       Radiology Dg Finger Thumb Right  Result Date: 07/28/2017 CLINICAL DATA:  Laceration EXAM: RIGHT THUMB 2+V COMPARISON:  None. FINDINGS: Frontal, oblique, and lateral views obtained. There is soft tissue injury to the distal aspect of the first digit. There are several small radiopaque foreign bodies within the soft tissues distally in the area of laceration. No fracture or dislocation. Joint spaces appear normal. No erosive change. IMPRESSION: Soft tissue laceration of the distal first digit with several linear radiopaque foreign bodies in this area of laceration. No fracture or dislocation. No appreciable arthropathic change. Electronically Signed   By: Bretta Bang III M.D.   On: 07/28/2017 19:04    Procedures .Marland KitchenLaceration Repair Date/Time: 07/28/2017 8:41 PM Performed by: Lanelle Bal Authorized by: Blane Ohara   Consent:    Consent  obtained:  Verbal   Consent given by:  Patient   Risks discussed:  Infection and pain   Alternatives discussed:  No treatment Anesthesia (see MAR for exact dosages):    Anesthesia method:  Local infiltration   Local anesthetic:  Lidocaine 1% w/o epi Laceration details:    Location:  Finger   Finger location:  R thumb   Length (cm):  3   Depth (mm):  12 Repair type:    Repair type:  Simple Pre-procedure details:    Preparation:  Patient was prepped and draped in usual sterile fashion Exploration:    Hemostasis achieved with:  Direct pressure   Wound exploration: wound explored through full range of motion     Wound extent: muscle damage, nerve damage and vascular damage     Wound extent: no foreign bodies/material noted and no underlying fracture noted     Contaminated: no   Treatment:    Area cleansed with:  Betadine and saline   Amount of cleaning:  Extensive   Irrigation solution:  Sterile saline   Irrigation volume:    Irrigation method:  Pressure wash   Visualized foreign bodies/material removed: no   Skin repair:    Repair method:  Sutures   Suture size:  4-0   Suture material:  Nylon   Suture technique:  Simple interrupted   Number of sutures:  8 Approximation:    Approximation:  Close   Vermilion border: well-aligned   Post-procedure details:    Dressing:  Antibiotic ointment and bulky dressing   Patient tolerance of procedure:  Tolerated well, no immediate complications    (including critical care time)  Medications Ordered in ED Medications - No data to display   Initial Impression / Assessment and Plan / ED Course  I have reviewed the triage vital signs and the nursing notes.  Pertinent labs & imaging results that were available during my care of the patient were reviewed by me and considered in my medical decision making (see chart for details).    Patient presented with a severe laceration secondary to accidental trauma at work. He denied  intentional injury to his thumb. Given his presentation, he is most likely suffering from an accidental trauma.   A procedure note was completed as above. Patients wound was bandaged prior to discharge by MD with ointment and secure gauze wrap.  He was instructed to return to the ED if he developed fever, nausea, vomiting, severe swelling-redness-pain of the site of repair or other concerning symptoms related to his injury. In addition, he was informed that he would need to visit a clinic of primary care office or ED for suture removal in 8-10 days and to avoid getting the wound area wet. He was prescribed bactrim for 7 days as he has previously developed hives to cephalosporins and penicillins. Direction for sutured wound care were included in his discharge paperwork, he was verbally directed to keep the wound dry and clean as well.  Patient seen and evaluated with Dr. Jodi Mourning.  Final Clinical Impressions(s) / ED Diagnoses   Final diagnoses:  Laceration of right thumb, foreign body presence unspecified, nail damage status unspecified, initial encounter    New Prescriptions Discharge Medication List as of 07/28/2017  8:43 PM    START taking these medications   Details  sulfamethoxazole-trimethoprim (BACTRIM DS,SEPTRA DS) 800-160 MG tablet Take 1 tablet by mouth 2 (two) times daily., Starting Sat 07/28/2017, Print         Lanelle Bal, MD 07/29/17 Newton Pigg    Blane Ohara, MD 07/31/17 1827

## 2017-08-01 ENCOUNTER — Encounter (HOSPITAL_COMMUNITY): Payer: Self-pay | Admitting: Emergency Medicine

## 2017-08-01 ENCOUNTER — Emergency Department (HOSPITAL_COMMUNITY)
Admission: EM | Admit: 2017-08-01 | Discharge: 2017-08-01 | Disposition: A | Discharge status: Home / Self Care | Payer: Worker's Compensation | Attending: Emergency Medicine | Admitting: Emergency Medicine

## 2017-08-01 DIAGNOSIS — Z026 Encounter for examination for insurance purposes: Secondary | ICD-10-CM | POA: Diagnosis present

## 2017-08-01 DIAGNOSIS — F17210 Nicotine dependence, cigarettes, uncomplicated: Secondary | ICD-10-CM | POA: Insufficient documentation

## 2017-08-01 DIAGNOSIS — J449 Chronic obstructive pulmonary disease, unspecified: Secondary | ICD-10-CM | POA: Insufficient documentation

## 2017-08-01 DIAGNOSIS — Z09 Encounter for follow-up examination after completed treatment for conditions other than malignant neoplasm: Secondary | ICD-10-CM

## 2017-08-01 NOTE — ED Provider Notes (Signed)
[facility]-EMERGENCY DEPT Provider Note   CSN: [id number] Arrival date & time: 08/01/17  1555     History   Chief Complaint Chief Complaint  Patient presents with  . Follow-up    HPI Kristopher Kim is a 31 y.o. male with h/o recent right thumb laceration after suture repair on 07/28/17 at [facility] ED who presents to ED requesting worker's comp paperwork to fill out. States his employer needs "proof" that he was here and worker's comp paperwork. He does not know exactly what type of paper work he needs. Denies fevers, pain, swelling or discharge to laceration. Has been applying antibiotic ointment to thumb laceration. Most recent bandage change this AM.   HPI  Past Medical History:  Diagnosis Date  . Bipolar 1 disorder (HCC)   . COPD (chronic obstructive pulmonary disease) (HCC)   . Traumatic brain injury (HCC)     There are no active problems to display for this patient.   History reviewed. No pertinent surgical history.     Home Medications    Prior to Admission medications   Medication Sig Start Date End Date Taking? Authorizing Provider  bacitracin-polymyxin b (POLYSPORIN) ophthalmic ointment Place 1 application into the left eye 3 (three) times daily. Patient not taking: Reported on 06/19/2017 11/06/16   Raeford Razor, MD  cephALEXin (KEFLEX) 500 MG capsule Take 1 capsule (500 mg total) by mouth 3 (three) times daily. Patient not taking: Reported on 06/19/2017 07/10/14   Trixie Dredge, PA-C  HYDROcodone-acetaminophen (NORCO/VICODIN) 5-325 MG per tablet Take 1 tablet by mouth every 4 (four) hours as needed for moderate pain or severe pain. Patient not taking: Reported on 06/19/2017 07/10/14   Trixie Dredge, PA-C  ibuprofen (ADVIL,MOTRIN) 800 MG tablet Take 1 tablet (800 mg total) by mouth every 8 (eight) hours as needed for mild pain or moderate pain. Patient not taking: Reported on 06/19/2017 07/10/14   Trixie Dredge, PA-C  oxyCODONE-acetaminophen (PERCOCET/ROXICET) 5-325 MG per tablet Take  1-2 tablets by mouth every 4 (four) hours as needed for severe pain. Patient not taking: Reported on 06/19/2017 10/27/14   Mirian Mo, MD  sulfamethoxazole-trimethoprim (BACTRIM DS,SEPTRA DS) 800-160 MG tablet Take 1 tablet by mouth 2 (two) times daily. 07/28/17   Lanelle Bal, MD    Family History No family history on file.  Social History Social History  Substance Use Topics  . Smoking status: Current Every Day Smoker    Packs/day: 0.50    Types: Cigarettes  . Smokeless tobacco: Never Used  . Alcohol use No     Comment: quit 2016     Allergies   Ceclor [cefaclor] and Penicillins   Review of Systems Review of Systems  Constitutional: Negative for fever.  Skin: Positive for wound. Negative for color change.     Physical Exam Updated Vital Signs BP 121/75 (BP Location: Left Arm)   Pulse (!) 52   Temp 99 F (37.2 C) (Oral)   Resp 16   SpO2 99%   Physical Exam  Constitutional: He is oriented to person, place, and time. He appears well-developed and well-nourished. No distress.  NAD.  HENT:  Head: Normocephalic and atraumatic.  Right Ear: External ear normal.  Left Ear: External ear normal.  Nose: Nose normal.  Eyes: Conjunctivae and EOM are normal. No scleral icterus.  Neck: Normal range of motion. Neck supple.  Cardiovascular: Normal rate, regular rhythm, normal heart sounds and intact distal pulses.   No murmur heard. Pulmonary/Chest: Effort normal and breath sounds normal. He has no  wheezes.  Musculoskeletal: Normal range of motion. He exhibits no deformity.  Full R thumb opposition, able to flex thumb at MCP without pain or difficulty  Neurological: He is alert and oriented to person, place, and time.  Skin: Skin is warm and dry. Capillary refill takes less than 2 seconds.  Right thumb has non adhesive dressing on, clean, dry without evidence of bleeding.  Psychiatric: He has a normal mood and affect. His behavior is normal. Judgment and thought  content normal.  Nursing note and vitals reviewed.    ED Treatments / Results  Labs (all labs ordered are listed, but only abnormal results are displayed) Labs Reviewed - No data to display  EKG  EKG Interpretation None       Radiology No results found.  Procedures Procedures (including critical care time)  Medications Ordered in ED Medications - No data to display   Initial Impression / Assessment and Plan / ED Course  I have reviewed the triage vital signs and the nursing notes.  Pertinent labs & imaging results that were available during my care of the patient were reviewed by me and considered in my medical decision making (see chart for details).  Clinical Course as of Aug 02 1999  Wed Aug 01, 2017  1658 Patient not in the bed when attempted to make initial contact.  [SJ]    Clinical Course User Index [SJ] Joy, Shawn C, PA-C   31 year old male presents to the ED requesting workers comp paperwork. He was seen in the ED on 07/28/17 after work place accident in which he sustained a right thumb laceration. He had an uncomplicated suture repair at that time. Denies complications or pain to laceration. Has been taking care of wound appropriately. No h/o DM but he does smoke, slight risk for poor wound healing. He is here for paperwork as requested by employer. I contacted registration who assisted patient with paper work. Pt was given wound care instructions and advised to return for suture removal in 5-6 days. He was given a work note stating he is to be cleared medically before returning to work as he has a job which puts him at high risk for infection and laceration dehiscence.   Final Clinical Impressions(s) / ED Diagnoses   Final diagnoses:  Follow up    New Prescriptions New Prescriptions   No medications on file     Jerrell Mylar 08/01/17 Candelaria Celeste, MD 08/02/17 Burna Mortimer

## 2017-08-01 NOTE — ED Notes (Signed)
Patient here to get copies of discharge paperwork for workers comp

## 2017-08-01 NOTE — Discharge Instructions (Signed)
You came to emergency department to fill out worker's compensation follow up after your emergency department visit on 07/28/2016 after injury to your thumb at work.   You filled out worker's compensation paperwork today with registration.   You need to get your sutures removed in 5-6 days. A medical provider needs to clear you medically before going back to work.  Monitor for signs of infection including swelling, redness, fever, decreased range of motion.

## 2017-08-01 NOTE — ED Triage Notes (Signed)
Pt requesting to be seen to get a note for work regarding work restrictions and partial amputation of right thumb that he was seen for here Saturday. Denies fever. VSS.

## 2017-08-05 ENCOUNTER — Emergency Department (HOSPITAL_COMMUNITY)
Admission: EM | Admit: 2017-08-05 | Discharge: 2017-08-05 | Disposition: A | Discharge status: Home / Self Care | Payer: Worker's Compensation | Attending: Emergency Medicine | Admitting: Emergency Medicine

## 2017-08-05 ENCOUNTER — Encounter (HOSPITAL_COMMUNITY): Payer: Self-pay

## 2017-08-05 DIAGNOSIS — Z79899 Other long term (current) drug therapy: Secondary | ICD-10-CM | POA: Diagnosis not present

## 2017-08-05 DIAGNOSIS — J449 Chronic obstructive pulmonary disease, unspecified: Secondary | ICD-10-CM | POA: Insufficient documentation

## 2017-08-05 DIAGNOSIS — Z4802 Encounter for removal of sutures: Secondary | ICD-10-CM | POA: Diagnosis not present

## 2017-08-05 DIAGNOSIS — F17210 Nicotine dependence, cigarettes, uncomplicated: Secondary | ICD-10-CM | POA: Diagnosis not present

## 2017-08-05 NOTE — ED Provider Notes (Signed)
[facility] DEPT Provider Note   CSN: [id number] Arrival date & time: 08/05/17  1327     History   Chief Complaint Chief Complaint  Patient presents with  . Suture / Staple Removal    HPI Kristopher Kim is a 31 y.o. male.  HPI  Patient presents to ED for suture removal. Sutures were placed on right thumb after sustaining injury at work when heavy metal shears accidentally lacerated his right thumb 8 days ago. He reports compliance with antibiotic ointment and keeping the area clean. He states that he has one day left of his Bactrim prescription, which has given him intermittent diarrhea. He denies any drainage, bleeding, increased swelling, signs of infection or fevers.  Past Medical History:  Diagnosis Date  . Bipolar 1 disorder (HCC)   . COPD (chronic obstructive pulmonary disease) (HCC)   . Traumatic brain injury (HCC)     There are no active problems to display for this patient.   History reviewed. No pertinent surgical history.     Home Medications    Prior to Admission medications   Medication Sig Start Date End Date Taking? Authorizing Provider  bacitracin-polymyxin b (POLYSPORIN) ophthalmic ointment Place 1 application into the left eye 3 (three) times daily. Patient not taking: Reported on 06/19/2017 11/06/16   Raeford Razor, MD  cephALEXin (KEFLEX) 500 MG capsule Take 1 capsule (500 mg total) by mouth 3 (three) times daily. Patient not taking: Reported on 06/19/2017 07/10/14   Trixie Dredge, PA-C  HYDROcodone-acetaminophen (NORCO/VICODIN) 5-325 MG per tablet Take 1 tablet by mouth every 4 (four) hours as needed for moderate pain or severe pain. Patient not taking: Reported on 06/19/2017 07/10/14   Trixie Dredge, PA-C  ibuprofen (ADVIL,MOTRIN) 800 MG tablet Take 1 tablet (800 mg total) by mouth every 8 (eight) hours as needed for mild pain or moderate pain. Patient not taking: Reported on 06/19/2017 07/10/14   Trixie Dredge, PA-C  oxyCODONE-acetaminophen  (PERCOCET/ROXICET) 5-325 MG per tablet Take 1-2 tablets by mouth every 4 (four) hours as needed for severe pain. Patient not taking: Reported on 06/19/2017 10/27/14   Mirian Mo, MD  sulfamethoxazole-trimethoprim (BACTRIM DS,SEPTRA DS) 800-160 MG tablet Take 1 tablet by mouth 2 (two) times daily. 07/28/17   Lanelle Bal, MD    Family History History reviewed. No pertinent family history.  Social History Social History  Substance Use Topics  . Smoking status: Current Every Day Smoker    Packs/day: 0.50    Types: Cigarettes  . Smokeless tobacco: Never Used  . Alcohol use No     Comment: quit 2016     Allergies   Ceclor [cefaclor] and Penicillins   Review of Systems Review of Systems  Constitutional: Negative for chills and fever.  Gastrointestinal: Positive for diarrhea. Negative for nausea and vomiting.  Skin: Positive for wound. Negative for color change.  Neurological: Negative for dizziness.     Physical Exam Updated Vital Signs BP 100/74 (BP Location: Left Arm)   Pulse 64   Temp 97.9 F (36.6 C) (Oral)   Resp 16   Ht 6\' 1"  (1.854 m)   Wt 78 kg (172 lb)   SpO2 97%   BMI 22.69 kg/m   Physical Exam  Constitutional: He appears well-developed and well-nourished. No distress.  HENT:  Head: Normocephalic and atraumatic.  Eyes: Conjunctivae and EOM are normal. No scleral icterus.  Neck: Normal range of motion.  Pulmonary/Chest: Effort normal. No respiratory distress.  Neurological: He is alert.  Skin: No rash noted. He  is not diaphoretic. No erythema.  Well-healing laceration on right thumb. 8 sutures in place. Pain with flexion of thumb. No sensation to tip of thumb consistent with previous exam. No drainage or bleeding from site.  Psychiatric: He has a normal mood and affect.  Nursing note and vitals reviewed.    ED Treatments / Results  Labs (all labs ordered are listed, but only abnormal results are displayed) Labs Reviewed - No data to  display  EKG  EKG Interpretation None       Radiology No results found.  Procedures .Suture Removal Date/Time: 08/05/2017 2:54 PM Performed by: Dietrich Pates Authorized by: Dietrich Pates   Consent:    Consent obtained:  Verbal   Consent given by:  Patient   Risks discussed:  Bleeding, pain and wound separation Location:    Location:  Upper extremity   Upper extremity location:  Hand   Hand location:  R thumb Procedure details:    Wound appearance:  Good wound healing and no signs of infection   Number of sutures removed:  8 Post-procedure details:    Patient tolerance of procedure:  Tolerated well, no immediate complications   (including critical care time)  Medications Ordered in ED Medications - No data to display   Initial Impression / Assessment and Plan / ED Course  I have reviewed the triage vital signs and the nursing notes.  Pertinent labs & imaging results that were available during my care of the patient were reviewed by me and considered in my medical decision making (see chart for details).     Patient presents for suture removal. 8 sutures were placed approximately 8 days ago after suffering from injury at work where metal shears is lacerated the skin on the thumb. He denies any drainage, bleeding from site. He continues to have pain with flexion of thumb as well as no sensation to the tip of the thumb. He is afebrile with no history of fever. Sutures were removed. Encouraged patient to continue Bactrim. Given information for wound care. Patient appears stable for discharge at this time. Strict return precautions given.  Final Clinical Impressions(s) / ED Diagnoses   Final diagnoses:  Visit for suture removal    New Prescriptions New Prescriptions   No medications on file     Dietrich Pates, PA-C 08/05/17 1455    Arby Barrette, MD 08/06/17 1510

## 2017-08-05 NOTE — Discharge Instructions (Signed)
Please read attached information regarding wound care. Continue home medications and antibiotics as previously prescribed. Return to ED for increased swelling, additional injury, drainage, bleeding from site or signs of infection.

## 2017-08-05 NOTE — ED Triage Notes (Signed)
Pt here for eval on right thumb.  Sutures in place.

## 2017-08-05 NOTE — ED Notes (Signed)
Sutures intact to right thumb.

## 2017-08-06 ENCOUNTER — Encounter (HOSPITAL_COMMUNITY): Payer: Self-pay | Admitting: Emergency Medicine

## 2017-08-06 ENCOUNTER — Emergency Department (HOSPITAL_COMMUNITY)
Admission: EM | Admit: 2017-08-06 | Discharge: 2017-08-06 | Disposition: A | Discharge status: Home / Self Care | Payer: Worker's Compensation | Attending: Emergency Medicine | Admitting: Emergency Medicine

## 2017-08-06 DIAGNOSIS — Y939 Activity, unspecified: Secondary | ICD-10-CM | POA: Insufficient documentation

## 2017-08-06 DIAGNOSIS — J449 Chronic obstructive pulmonary disease, unspecified: Secondary | ICD-10-CM | POA: Insufficient documentation

## 2017-08-06 DIAGNOSIS — W268XXD Contact with other sharp object(s), not elsewhere classified, subsequent encounter: Secondary | ICD-10-CM | POA: Insufficient documentation

## 2017-08-06 DIAGNOSIS — Y929 Unspecified place or not applicable: Secondary | ICD-10-CM | POA: Insufficient documentation

## 2017-08-06 DIAGNOSIS — Y999 Unspecified external cause status: Secondary | ICD-10-CM | POA: Diagnosis not present

## 2017-08-06 DIAGNOSIS — S61011D Laceration without foreign body of right thumb without damage to nail, subsequent encounter: Secondary | ICD-10-CM | POA: Diagnosis not present

## 2017-08-06 DIAGNOSIS — F17210 Nicotine dependence, cigarettes, uncomplicated: Secondary | ICD-10-CM | POA: Diagnosis not present

## 2017-08-06 DIAGNOSIS — S6991XD Unspecified injury of right wrist, hand and finger(s), subsequent encounter: Secondary | ICD-10-CM | POA: Diagnosis present

## 2017-08-06 MED ORDER — NAPROXEN 500 MG PO TABS
500.0000 mg | ORAL_TABLET | Freq: Two times a day (BID) | ORAL | 0 refills | Status: DC
Start: 2017-08-06 — End: 2019-11-12

## 2017-08-06 MED ORDER — IBUPROFEN 400 MG PO TABS
ORAL_TABLET | ORAL | Status: AC
  Filled 2017-08-06: qty 1

## 2017-08-06 NOTE — ED Provider Notes (Signed)
[facility] Provider Note   CSN: [id number] Arrival date & time: 08/06/17  1448     History   Chief Complaint Chief Complaint  Patient presents with  . Hand Pain    HPI Kristopher Kim is a 31 y.o. right handed male who presents emergency department today for possible reopening of thumb laceration. Patient initially presented here on 07/28/17 after cutting the distal 1.5 cm his right thumb with heavy metal shears. This was repaired with 8 nonabsorbable sutures. The patient at that time was noted to have no sensation to the tip of his thumb. Yesterday 08/05/2017 sutures removed. Patient notes that he is without dressing on him this morning when bending down to tie his shoes the wound reopen. This occurred at approximately 10 AM this morning. The patient was prescribed Bactrim during his initial visit. He states that he has missed a few doses of this and still has 2 pills left. He has been applying bacitracin to the wound. The patient works as a Herbalist. 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 He denies any drainage, bleeding, fevers, chills, surrounding redness, swelling of the joint. Patient states that he had a tetanus shot updated ~2.5 years ago.  HPI  Past Medical History:  Diagnosis Date  . Bipolar 1 disorder (HCC)   . COPD (chronic obstructive pulmonary disease) (HCC)   . Traumatic brain injury (HCC)     There are no active problems to display for this patient.   History reviewed. No pertinent surgical history.     Home Medications    Prior to Admission medications   Medication Sig Start Date End Date Taking? Authorizing Provider  bacitracin-polymyxin b (POLYSPORIN) ophthalmic ointment Place 1 application into the left eye 3 (three) times daily. Patient not taking: Reported on 06/19/2017 11/06/16   Raeford Razor, MD  cephALEXin (KEFLEX) 500 MG capsule Take 1 capsule (500 mg total) by mouth 3 (three)  times daily. Patient not taking: Reported on 06/19/2017 07/10/14   Trixie Dredge, PA-C  HYDROcodone-acetaminophen (NORCO/VICODIN) 5-325 MG per tablet Take 1 tablet by mouth every 4 (four) hours as needed for moderate pain or severe pain. Patient not taking: Reported on 06/19/2017 07/10/14   Trixie Dredge, PA-C  ibuprofen (ADVIL,MOTRIN) 800 MG tablet Take 1 tablet (800 mg total) by mouth every 8 (eight) hours as needed for mild pain or moderate pain. Patient not taking: Reported on 06/19/2017 07/10/14   Trixie Dredge, PA-C  oxyCODONE-acetaminophen (PERCOCET/ROXICET) 5-325 MG per tablet Take 1-2 tablets by mouth every 4 (four) hours as needed for severe pain. Patient not taking: Reported on 06/19/2017 10/27/14   Mirian Mo, MD  sulfamethoxazole-trimethoprim (BACTRIM DS,SEPTRA DS) 800-160 MG tablet Take 1 tablet by mouth 2 (two) times daily. 07/28/17   Lanelle Bal, MD    Family History No family history on file.  Social History Social History  Substance Use Topics  . Smoking status: Current Every Day Smoker    Packs/day: 0.50    Types: Cigarettes  . Smokeless tobacco: Never Used  . Alcohol use No     Comment: quit 2016     Allergies   Ceclor [cefaclor] and Penicillins   Review of Systems Review of Systems  Constitutional: Negative for chills and fever.  Musculoskeletal: Negative for arthralgias and joint swelling.  Skin: Positive for wound.     Physical Exam Updated Vital Signs BP 105/74   Pulse 85   Temp 98.3 F (  36.8 C) (Oral)   Resp (!) 169   SpO2 99%   Physical Exam  Constitutional: He appears well-developed and well-nourished.  HENT:  Head: Normocephalic and atraumatic.  Right Ear: External ear normal.  Left Ear: External ear normal.  Eyes: Conjunctivae are normal. Right eye exhibits no discharge. Left eye exhibits no discharge. No scleral icterus.  Cardiovascular:  Pulses:      Radial pulses are 2+ on the right side, and 2+ on the left side.    Pulmonary/Chest: Effort normal. No respiratory distress.  Musculoskeletal:  ROM to thumb IP joints intact. Noted to be painful with flexion of IP joints. Resisted extension of IP joints intact. Cap refill <2 seconds.   Neurological: He is alert.  Skin: Skin is warm and dry. No pallor.  1.5 cm laceration to the distal tip of the right thumb not involving the nailbed or nail. Area over nailbed appears to be healing appropriately. 1 cm area adjacent to the nail open. No surrounding erythema or heat. No drainage or foreign body.. Numbness to the distal tip of the thumb.  Psychiatric: He has a normal mood and affect.  Nursing note and vitals reviewed.    ED Treatments / Results  Labs (all labs ordered are listed, but only abnormal results are displayed) Labs Reviewed - No data to display  EKG  EKG Interpretation None       Radiology No results found.  Procedures .Marland KitchenLaceration Repair Date/Time: 08/06/2017 6:33 PM Performed by: Jacinto Halim Authorized by: Jacinto Halim   Consent:    Consent obtained:  Verbal   Consent given by:  Patient   Risks discussed:  Infection, need for additional repair, nerve damage, poor wound healing, poor cosmetic result, pain, retained foreign body, tendon damage and vascular damage   Alternatives discussed:  No treatment and referral Anesthesia (see MAR for exact dosages):    Anesthesia method:  None Laceration details:    Location:  Finger   Finger location:  R thumb   Length (cm):  1.5 Repair type:    Repair type:  Simple Exploration:    Contaminated: no   Treatment:    Area cleansed with:  Betadine   Irrigation solution:  Sterile water   Irrigation volume:    Irrigation method:  Pressure wash   Visualized foreign bodies/material removed: no   Skin repair:    Repair method:  Tissue adhesive Approximation:    Approximation:  Loose Post-procedure details:    Dressing:  Non-adherent dressing and sterile dressing    Patient tolerance of procedure:  Tolerated well, no immediate complications   (including critical care time)  Medications Ordered in ED Medications  ibuprofen (ADVIL,MOTRIN) 400 MG tablet (not administered)     Initial Impression / Assessment and Plan / ED Course  I have reviewed the triage vital signs and the nursing notes.  Pertinent labs & imaging results that were available during my care of the patient were reviewed by me and considered in my medical decision making (see chart for details).     31 year old male with dehiscence of dominant right hand thumb laceration previously repaired on 07/28/17. Patient is currently on antibiotics. No obvious signs of infection on exam. Pressure irrigation performed. Wound explored and base of wound visualized in a bloodless field without evidence of foreign body. Wound repaired with Steri-Strips with loose approximation. Will have patient continue course and finish antibiotics. We will have the patient follow with hand surgery for further treatment of this.  Strict return precautions discussed. Patient appears stable for discharge.  Patient case discussed with Dr. Rosalia Hammers who is in agreement with plan.  Final Clinical Impressions(s) / ED Diagnoses   Final diagnoses:  None    New Prescriptions New Prescriptions   No medications on file     Princella Pellegrini 08/07/17 0056    Margarita Grizzle, MD 08/09/17 [phone]

## 2017-08-06 NOTE — ED Triage Notes (Addendum)
States thinks he opened his thumb lac again, had sutures out yesterday

## 2017-08-06 NOTE — Discharge Instructions (Signed)
Please follow up with Hand. Call and schedule an appointment. Your wound was closed using steri-strips. Please complete your course of antibiotics. I am prescribing you naproxen to take as needed for pain. Take this medication up to 2 times daily with food. Please do not combine medication with Ibuprofen/Advil.  Keep the wound clean and dry. Change bandage at least 1 time per day. You should also change it if it gets dirty or wet. Do not get the skin adhesive strips wet. You can take a shower or a bath, but be careful to keep the wound dry. If the wound gets wet, pat it dry with a clean towel. Do not rub the wound. Skin adhesive strips fall off on their own. You can trim the strips as the wound heals. Do not remove any strips that are still stuck to the wound. They will fall off after a while. Do not scratch or pick at the wound. Keep all follow-up visits as told by your doctor. This is important. Check your wound every day for signs of infection. Watch for: Redness, swelling, or pain. Fluid, blood, or pus. Raise (elevate) the injured area above the level of your heart while you are sitting or lying down, if possible. Get help if: You have a fever. A wound that was closed breaks open. You notice a bad smell coming from your wound or your bandage. You notice something coming out of the wound, such as wood or glass. Medicine does not help your pain. You have more redness, swelling, or pain at the site of your wound. You have fluid, blood, or pus coming from your wound. You notice a change in the color of your skin near your wound. You need to change the bandage often because fluid, blood, or pus is coming from the wound. You start to have a new rash. You start to have numbness around the wound. Get help right away if: You have very bad swelling around the wound. Your pain suddenly gets worse and is very bad. You notice painful lumps near the wound or on skin that is anywhere on your  body. You have a red streak going away from your wound. The wound is on your hand or foot and you cannot move a finger or toe like you usually can. The wound is on your hand or foot and you notice that your fingers or toes look pale or bluish.

## 2017-08-06 NOTE — ED Notes (Signed)
See EDP assessment for secondary

## 2017-09-12 ENCOUNTER — Emergency Department (HOSPITAL_COMMUNITY): Payer: Self-pay

## 2017-09-12 ENCOUNTER — Encounter (HOSPITAL_COMMUNITY): Payer: Self-pay | Admitting: *Deleted

## 2017-09-12 ENCOUNTER — Emergency Department (HOSPITAL_COMMUNITY)
Admission: EM | Admit: 2017-09-12 | Discharge: 2017-09-12 | Disposition: A | Discharge status: Home / Self Care | Payer: Self-pay | Attending: Emergency Medicine | Admitting: Emergency Medicine

## 2017-09-12 DIAGNOSIS — K4090 Unilateral inguinal hernia, without obstruction or gangrene, not specified as recurrent: Secondary | ICD-10-CM | POA: Insufficient documentation

## 2017-09-12 DIAGNOSIS — Y939 Activity, unspecified: Secondary | ICD-10-CM | POA: Insufficient documentation

## 2017-09-12 DIAGNOSIS — J449 Chronic obstructive pulmonary disease, unspecified: Secondary | ICD-10-CM | POA: Insufficient documentation

## 2017-09-12 DIAGNOSIS — T148XXA Other injury of unspecified body region, initial encounter: Secondary | ICD-10-CM

## 2017-09-12 DIAGNOSIS — X58XXXA Exposure to other specified factors, initial encounter: Secondary | ICD-10-CM | POA: Insufficient documentation

## 2017-09-12 DIAGNOSIS — Z79899 Other long term (current) drug therapy: Secondary | ICD-10-CM | POA: Insufficient documentation

## 2017-09-12 DIAGNOSIS — Y929 Unspecified place or not applicable: Secondary | ICD-10-CM | POA: Insufficient documentation

## 2017-09-12 DIAGNOSIS — F17210 Nicotine dependence, cigarettes, uncomplicated: Secondary | ICD-10-CM | POA: Insufficient documentation

## 2017-09-12 DIAGNOSIS — Y999 Unspecified external cause status: Secondary | ICD-10-CM | POA: Insufficient documentation

## 2017-09-12 DIAGNOSIS — S90425A Blister (nonthermal), left lesser toe(s), initial encounter: Secondary | ICD-10-CM | POA: Insufficient documentation

## 2017-09-12 MED ORDER — DOXYCYCLINE HYCLATE 100 MG PO CAPS: 100.0000 mg | ORAL_CAPSULE | Freq: Two times a day (BID) | ORAL | 0 refills | Status: DC

## 2017-09-12 NOTE — ED Provider Notes (Signed)
[facility]-EMERGENCY DEPT Provider Note   CSN: [id number] Arrival date & time: 09/12/17  1247     History   Chief Complaint Chief Complaint  Patient presents with  . Toe Pain    HPI Kristopher Kim is a 31 y.o. male.  HPI 31 year old Caucasian male past medical history significant for bipolar disorder presents to the ED today with complaints of blister to his left fourth toe and inguinal hernia. States it has been there for the past 2 weeks and not improving. States that he wears steel toed boots for shoes and thinks that the swelling of the toe makes his symptoms worse. He reports pain to the toe itself. Denies any purulent drainage. Does report some erythema. Denies any known injury.denies any history of diabetes. Is worried that he may have a wart.  Patient also presents to the ED for evaluation of left sided inguinal hernia that has been there for several months. Patient states that he's been seen in the ED for same for other things and forgets to mention it.intermittent pain. Reports normal bowel movements. No testicular pain or swelling. No urinary symptoms. Denies any nausea or vomiting. Denies any erythema over the site. Denies any associated fevers. He has not seen a Careers adviser for this. Worse with straining or bending over.  Pt denies any fever, chill, ha, vision changes, lightheadedness, dizziness, congestion, neck pain, cp, sob, cough, abd pain, n/v/d, urinary symptoms, change in bowel habits, melena, hematochezia, lower extremity paresthesias.  Past Medical History:  Diagnosis Date  . Bipolar 1 disorder (HCC)   . COPD (chronic obstructive pulmonary disease) (HCC)   . Traumatic brain injury (HCC)     There are no active problems to display for this patient.   History reviewed. No pertinent surgical history.     Home Medications    Prior to Admission medications   Medication Sig Start Date End Date Taking? Authorizing Provider  bacitracin-polymyxin b (POLYSPORIN)  ophthalmic ointment Place 1 application into the left eye 3 (three) times daily. Patient not taking: Reported on 06/19/2017 11/06/16   Raeford Razor, MD  cephALEXin (KEFLEX) 500 MG capsule Take 1 capsule (500 mg total) by mouth 3 (three) times daily. Patient not taking: Reported on 06/19/2017 07/10/14   Trixie Dredge, PA-C  doxycycline (VIBRAMYCIN) 100 MG capsule Take 1 capsule (100 mg total) by mouth 2 (two) times daily. 09/12/17   Rise Mu, PA-C  HYDROcodone-acetaminophen (NORCO/VICODIN) 5-325 MG per tablet Take 1 tablet by mouth every 4 (four) hours as needed for moderate pain or severe pain. Patient not taking: Reported on 06/19/2017 07/10/14   Trixie Dredge, PA-C  ibuprofen (ADVIL,MOTRIN) 800 MG tablet Take 1 tablet (800 mg total) by mouth every 8 (eight) hours as needed for mild pain or moderate pain. Patient not taking: Reported on 06/19/2017 07/10/14   Trixie Dredge, PA-C  naproxen (NAPROSYN) 500 MG tablet Take 1 tablet (500 mg total) by mouth 2 (two) times daily. 08/06/17   Maczis, Elmer Sow, PA-C  oxyCODONE-acetaminophen (PERCOCET/ROXICET) 5-325 MG per tablet Take 1-2 tablets by mouth every 4 (four) hours as needed for severe pain. Patient not taking: Reported on 06/19/2017 10/27/14   Mirian Mo, MD  sulfamethoxazole-trimethoprim (BACTRIM DS,SEPTRA DS) 800-160 MG tablet Take 1 tablet by mouth 2 (two) times daily. 07/28/17   Lanelle Bal, MD    Family History No family history on file.  Social History Social History  Substance Use Topics  . Smoking status: Current Every Day Smoker    Packs/day: 0.50  Types: Cigarettes  . Smokeless tobacco: Never Used  . Alcohol use No     Comment: quit 2016     Allergies   Ceclor [cefaclor] and Penicillins   Review of Systems Review of Systems  Constitutional: Negative for chills and fever.  HENT: Negative for congestion and sore throat.   Eyes: Negative for visual disturbance.  Respiratory: Negative for cough and shortness of  breath.   Cardiovascular: Negative for chest pain.  Gastrointestinal: Negative for abdominal pain, diarrhea, nausea and vomiting.  Genitourinary: Negative for dysuria, flank pain, frequency, hematuria, scrotal swelling, testicular pain and urgency.       Hernia   Musculoskeletal: Negative for arthralgias and myalgias.  Skin: Positive for color change and wound. Negative for rash.  Neurological: Negative for dizziness, syncope, weakness, light-headedness, numbness and headaches.  Psychiatric/Behavioral: Negative for sleep disturbance. The patient is not nervous/anxious.      Physical Exam Updated Vital Signs BP 119/73 (BP Location: Left Arm)   Pulse 64   Temp 98 F (36.7 C) (Oral)   Resp 16   SpO2 100%   Physical Exam  Constitutional: He appears well-developed and well-nourished. No distress.  HENT:  Head: Normocephalic and atraumatic.  Eyes: Right eye exhibits no discharge. Left eye exhibits no discharge. No scleral icterus.  Neck: Normal range of motion.  Pulmonary/Chest: No respiratory distress.  Abdominal: Bowel sounds are normal. There is no tenderness. There is no rigidity, no rebound, no guarding, no CVA tenderness, no tenderness at McBurney's point and negative Murphy's sign. A hernia is present. Hernia confirmed positive in the left inguinal area.  Left inguinal hernia present. Non tender palpation. Hernia is reducible. Worsens with cough.No signs of erythema. Normal bowel sounds. No abdominal tenderness.  Musculoskeletal: Normal range of motion.  Patient with blister to the medial aspect of the fourth left toe. Skin is macerated. No present drainage. Mild erythema. Normal cap refill. Full range of motion. Nontender to palpation. No streaking.  Neurological: He is alert.  Skin: Skin is warm and dry. Capillary refill takes less than 2 seconds. No pallor.  Psychiatric: His behavior is normal. Judgment and thought content normal.  Nursing note and vitals reviewed.    ED  Treatments / Results  Labs (all labs ordered are listed, but only abnormal results are displayed) Labs Reviewed - No data to display  EKG  EKG Interpretation None       Radiology Dg Foot 2 Views Left  Result Date: 09/12/2017 CLINICAL DATA:  Fourth toe infection EXAM: LEFT FOOT - 2 VIEW COMPARISON:  None. FINDINGS: No acute fracture. No dislocation.  Unremarkable soft tissues. IMPRESSION: No acute bony pathology. Electronically Signed   By: Jolaine Click M.D.   On: 09/12/2017 17:20    Procedures Procedures (including critical care time)  Medications Ordered in ED Medications - No data to display   Initial Impression / Assessment and Plan / ED Course  I have reviewed the triage vital signs and the nursing notes.  Pertinent labs & imaging results that were available during my care of the patient were reviewed by me and considered in my medical decision making (see chart for details).     Patient presents to the ED with several complaints. First patient complains of blister to his left fourth toe.has been there for 2 weeks.denies history of diabetes. Denies any associated symptoms of fever, chills, decreased range of motion.  Patient has no signs of purulent drainage. Vital signs are reassuring. Appears to be a blister.  Skin is macerated. Will start patient on antibiotics given the slight surrounding cellulitis. X-ray shows no bony involvement. Encouraged follow-up with podiatry if symptoms are not improving.  Patient also complains of left-sided inguinal hernia. Worse with Valsalva and bending over. No signs of incarceration. Hernia is reducible. Non tender to palpation. Normal bowel sounds.  Discussed with the medical treatment patient and close follow-up with general surgery.  Pt is hemodynamically stable, in NAD, & able to ambulate in the ED. Evaluation does not show pathology that would require ongoing emergent intervention or inpatient treatment. I explained the diagnosis to  the patient. Pain has been managed & has no complaints prior to dc. Pt is comfortable with above plan and is stable for discharge at this time. All questions were answered prior to disposition. Strict return precautions for f/u to the ED were discussed. Encouraged follow up with PCP.   Final Clinical Impressions(s) / ED Diagnoses   Final diagnoses:  Blister  Unilateral inguinal hernia without obstruction or gangrene, recurrence not specified    New Prescriptions Discharge Medication List as of 09/12/2017  5:32 PM    START taking these medications   Details  doxycycline (VIBRAMYCIN) 100 MG capsule Take 1 capsule (100 mg total) by mouth 2 (two) times daily., Starting Wed 09/12/2017, Print         Rise Mu, PA-C 09/12/17 2059    Charlynne Pander, MD 09/13/17 [phone]

## 2017-09-12 NOTE — ED Triage Notes (Signed)
To ED for eval of left 4th toe pain for the past 2wks. Blister or callus noted. No injury

## 2017-09-12 NOTE — Discharge Instructions (Signed)
Please to take the antibiotic for infection. Your x-ray was normal. Need to follow-up with the general surgeon for your hernia. If he develops any nausea, difficulties having a bowel movement, redness around the area, worsening pain or fever return to the ED.

## 2018-01-24 ENCOUNTER — Emergency Department (HOSPITAL_COMMUNITY)
Admission: EM | Admit: 2018-01-24 | Discharge: 2018-01-24 | Disposition: A | Discharge status: Home / Self Care | Payer: Self-pay | Attending: Emergency Medicine | Admitting: Emergency Medicine

## 2018-01-24 ENCOUNTER — Encounter (HOSPITAL_COMMUNITY): Payer: Self-pay | Admitting: Emergency Medicine

## 2018-01-24 DIAGNOSIS — L0291 Cutaneous abscess, unspecified: Secondary | ICD-10-CM

## 2018-01-24 DIAGNOSIS — F17210 Nicotine dependence, cigarettes, uncomplicated: Secondary | ICD-10-CM | POA: Insufficient documentation

## 2018-01-24 DIAGNOSIS — L02415 Cutaneous abscess of right lower limb: Secondary | ICD-10-CM | POA: Insufficient documentation

## 2018-01-24 DIAGNOSIS — Z79899 Other long term (current) drug therapy: Secondary | ICD-10-CM | POA: Insufficient documentation

## 2018-01-24 DIAGNOSIS — J449 Chronic obstructive pulmonary disease, unspecified: Secondary | ICD-10-CM | POA: Insufficient documentation

## 2018-01-24 MED ORDER — LIDOCAINE HCL 2 % IJ SOLN
20.0000 mL | Freq: Once | INTRAMUSCULAR | Status: AC
  Administered 2018-01-24: 400 mg
  Filled 2018-01-24: qty 20

## 2018-01-24 MED ORDER — SULFAMETHOXAZOLE-TRIMETHOPRIM 800-160 MG PO TABS: 1.0000 | ORAL_TABLET | Freq: Two times a day (BID) | ORAL | 0 refills | Status: AC

## 2018-01-24 NOTE — ED Provider Notes (Signed)
[facility]   CSN: [id number] Arrival date & time: 01/24/18  1437     History   Chief Complaint Chief Complaint  Patient presents with  . Abscess    HPI Kristopher Kim is a 32 y.o. male.  HPI   32 year old male presents today with an abscess to his left thigh.  Patient notes over the last week he has had what appeared to be an ingrown hair and swelling to the area, he notes yesterday he was pushing on the area and was able to expel thick paste like material.  He notes very minor redness around the area, denies any fever chills nausea or vomiting.  No history of significant skin infections in the past.  Past Medical History:  Diagnosis Date  . Bipolar 1 disorder (HCC)   . COPD (chronic obstructive pulmonary disease) (HCC)   . Traumatic brain injury (HCC)     There are no active problems to display for this patient.   History reviewed. No pertinent surgical history.     Home Medications    Prior to Admission medications   Medication Sig Start Date End Date Taking? Authorizing Provider  bacitracin-polymyxin b (POLYSPORIN) ophthalmic ointment Place 1 application into the left eye 3 (three) times daily. Patient not taking: Reported on 06/19/2017 11/06/16   Raeford RazorKohut, Stephen, MD  cephALEXin (KEFLEX) 500 MG capsule Take 1 capsule (500 mg total) by mouth 3 (three) times daily. Patient not taking: Reported on 06/19/2017 07/10/14   Trixie DredgeWest, Emily, PA-C  doxycycline (VIBRAMYCIN) 100 MG capsule Take 1 capsule (100 mg total) by mouth 2 (two) times daily. 09/12/17   Rise MuLeaphart, Kenneth T, PA-C  HYDROcodone-acetaminophen (NORCO/VICODIN) 5-325 MG per tablet Take 1 tablet by mouth every 4 (four) hours as needed for moderate pain or severe pain. Patient not taking: Reported on 06/19/2017 07/10/14   Trixie DredgeWest, Emily, PA-C  ibuprofen (ADVIL,MOTRIN) 800 MG tablet Take 1 tablet (800 mg total) by mouth every 8 (eight) hours as needed for mild pain or  moderate pain. Patient not taking: Reported on 06/19/2017 07/10/14   Trixie DredgeWest, Emily, PA-C  naproxen (NAPROSYN) 500 MG tablet Take 1 tablet (500 mg total) by mouth 2 (two) times daily. 08/06/17   Maczis, Elmer SowMichael M, PA-C  oxyCODONE-acetaminophen (PERCOCET/ROXICET) 5-325 MG per tablet Take 1-2 tablets by mouth every 4 (four) hours as needed for severe pain. Patient not taking: Reported on 06/19/2017 10/27/14   Mirian MoGentry, Matthew, MD  sulfamethoxazole-trimethoprim (BACTRIM DS,SEPTRA DS) 800-160 MG tablet Take 1 tablet by mouth 2 (two) times daily for 7 days. 01/24/18 01/31/18  Eyvonne MechanicHedges, Erin Obando, PA-C    Family History History reviewed. No pertinent family history.  Social History Social History   Tobacco Use  . Smoking status: Current Every Day Smoker    Packs/day: 0.50    Types: Cigarettes  . Smokeless tobacco: Never Used  Substance Use Topics  . Alcohol use: No    Comment: quit 2016  . Drug use: Yes    Types: Marijuana     Allergies   Ceclor [cefaclor] and Penicillins   Review of Systems Review of Systems  All other systems reviewed and are negative.    Physical Exam Updated Vital Signs BP 127/70   Pulse (!) 45   Temp 98.4 F (36.9 C) (Oral)   Resp 17   Ht 6\' 1"  (1.854 m)   Wt 86.2 kg (190 lb)   SpO2 99%   BMI 25.07 kg/m   Physical Exam  Constitutional:  He is oriented to person, place, and time. He appears well-developed and well-nourished.  HENT:  Head: Normocephalic and atraumatic.  Eyes: Conjunctivae are normal. Pupils are equal, round, and reactive to light. Right eye exhibits no discharge. Left eye exhibits no discharge. No scleral icterus.  Neck: Normal range of motion. No JVD present. No tracheal deviation present.  Pulmonary/Chest: Effort normal. No stridor.  Musculoskeletal:  1 cm area of induration on the right lateral thigh, no purulence no surrounding redness  Neurological: He is alert and oriented to person, place, and time. Coordination normal.  Psychiatric:  He has a normal mood and affect. His behavior is normal. Judgment and thought content normal.  Nursing note and vitals reviewed.    ED Treatments / Results  Labs (all labs ordered are listed, but only abnormal results are displayed) Labs Reviewed - No data to display  EKG  EKG Interpretation None       Radiology No results found.  Procedures .Marland KitchenIncision and Drainage Date/Time: 01/24/2018 7:00 PM Performed by: Eyvonne Mechanic, PA-C Authorized by: Eyvonne Mechanic, PA-C   Consent:    Consent obtained:  Verbal   Consent given by:  Patient   Risks discussed:  Bleeding   Alternatives discussed:  No treatment, delayed treatment and alternative treatment Location:    Type:  Abscess   Size:  1.5   Location:  Lower extremity   Lower extremity location:  Leg   Leg location:  R upper leg Pre-procedure details:    Procedure prep: alcohol  Anesthesia (see MAR for exact dosages):    Anesthesia method:  Local infiltration   Local anesthetic:  Lidocaine 2% w/o epi Procedure type:    Complexity:  Simple Procedure details:    Needle aspiration: no     Incision types:  Single straight   Incision depth:  Dermal   Scalpel blade:  11   Wound management:  Probed and deloculated   Drainage:  Bloody   Drainage amount:  Scant   Wound treatment:  Wound left open   Packing materials:  None Post-procedure details:    Patient tolerance of procedure:  Tolerated well, no immediate complications   (including critical care time)  EMERGENCY DEPARTMENT US SOFT TISSUE INTERPRETATION "Study: Limited Soft Tissue Ultrasound"  INDICATIONS: Soft tissue infection Multiple views of the body part were obtained in real-time with a multi-frequency linear probe  PERFORMED BY: Myself IMAGES ARCHIVED?: Yes SIDE:Right  BODY PART:Lower extremity INTERPRETATION:  Abcess present    Medications Ordered in ED Medications  lidocaine (XYLOCAINE) 2 % (with pres) injection 400 mg (400 mg Infiltration  Given by Other 01/24/18 1826)     Initial Impression / Assessment and Plan / ED Course  I have reviewed the triage vital signs and the nursing notes.  Pertinent labs & imaging results that were available during my care of the patient were reviewed by me and considered in my medical decision making (see chart for details).     Final Clinical Impressions(s) / ED Diagnoses   Final diagnoses:  Abscess    Labs:   Imaging:  Consults:  Therapeutics:  Discharge Meds:   Assessment/Plan: 32 year old male presents today with infection to his lower extremity.  This is very minimal, bedside ultrasound shows what appeared to be a small pocket of fluid.  I&D showed this was bloody with no significant purulence.  Patient is requesting antibiotics, he will be given antibiotics encouraged to use warm compresses use if symptoms worsen return as needed.  He verbalized  understanding and agreement to today's plan had      ED Discharge Orders        Ordered    sulfamethoxazole-trimethoprim (BACTRIM DS,SEPTRA DS) 800-160 MG tablet  2 times daily     01/24/18 1826       Eyvonne Mechanic, PA-C 01/24/18 1901    Mancel Bale, MD 01/25/18 1125

## 2018-01-24 NOTE — ED Triage Notes (Signed)
Patient presents to ED for assessment of abscess to left thigh, patient already opened at home,  Thick puss noted at home

## 2018-01-24 NOTE — ED Notes (Signed)
Name called x 1

## 2018-01-24 NOTE — Discharge Instructions (Signed)
Please read attached information. If you experience any new or worsening signs or symptoms please return to the emergency room for evaluation. Please follow-up with your primary care provider or specialist as discussed. Please use medication prescribed only as directed and discontinue taking if you have any concerning signs or symptoms.   °

## 2018-01-24 NOTE — ED Notes (Signed)
Jeff PA at bedside.  

## 2018-03-13 IMAGING — DX DG FOOT 2V*L*
2 series · 2 of 2 positions shown · non-contrast
Comparison: None.

CLINICAL DATA: Fourth toe infection

EXAM:
LEFT FOOT - 2 VIEW

[foot ap]
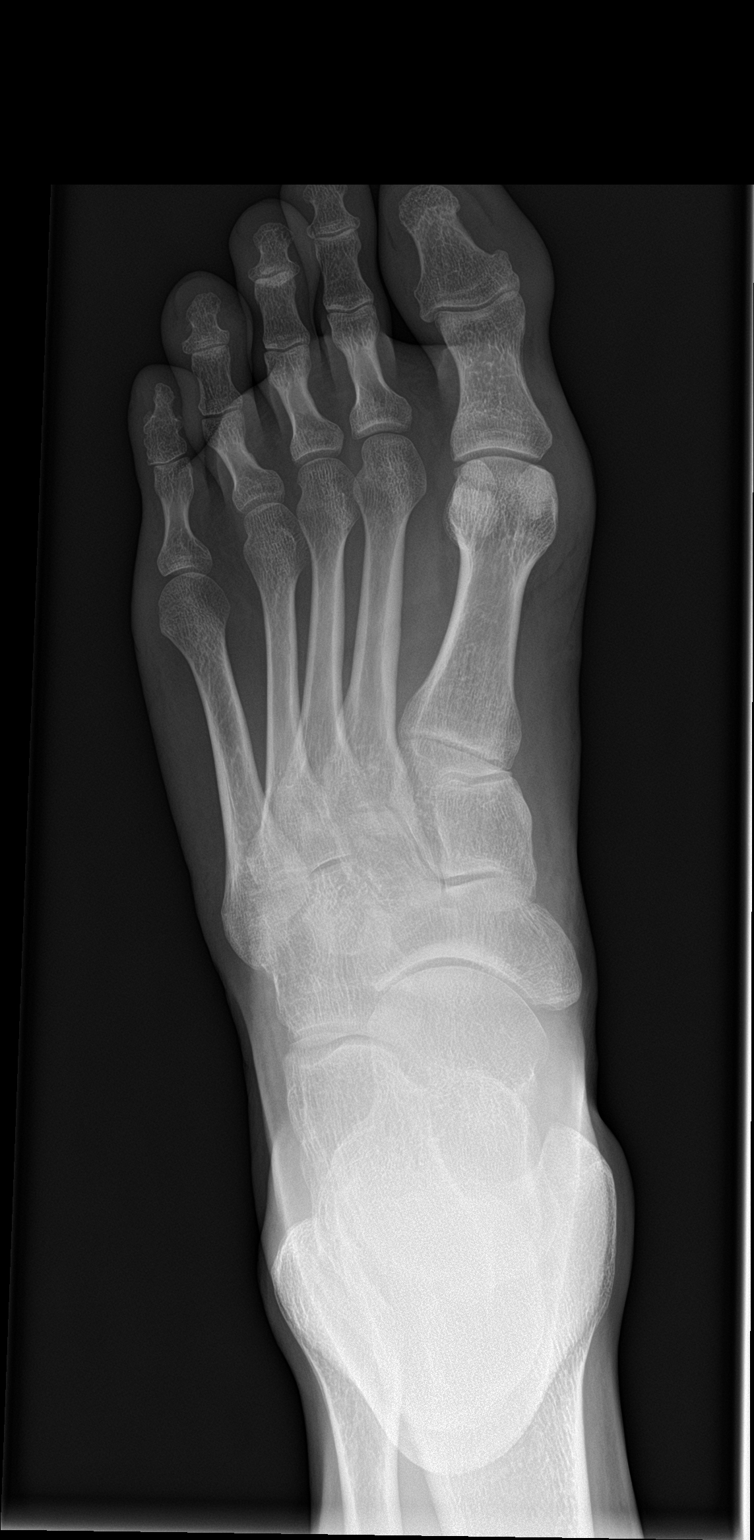

[foot lat]
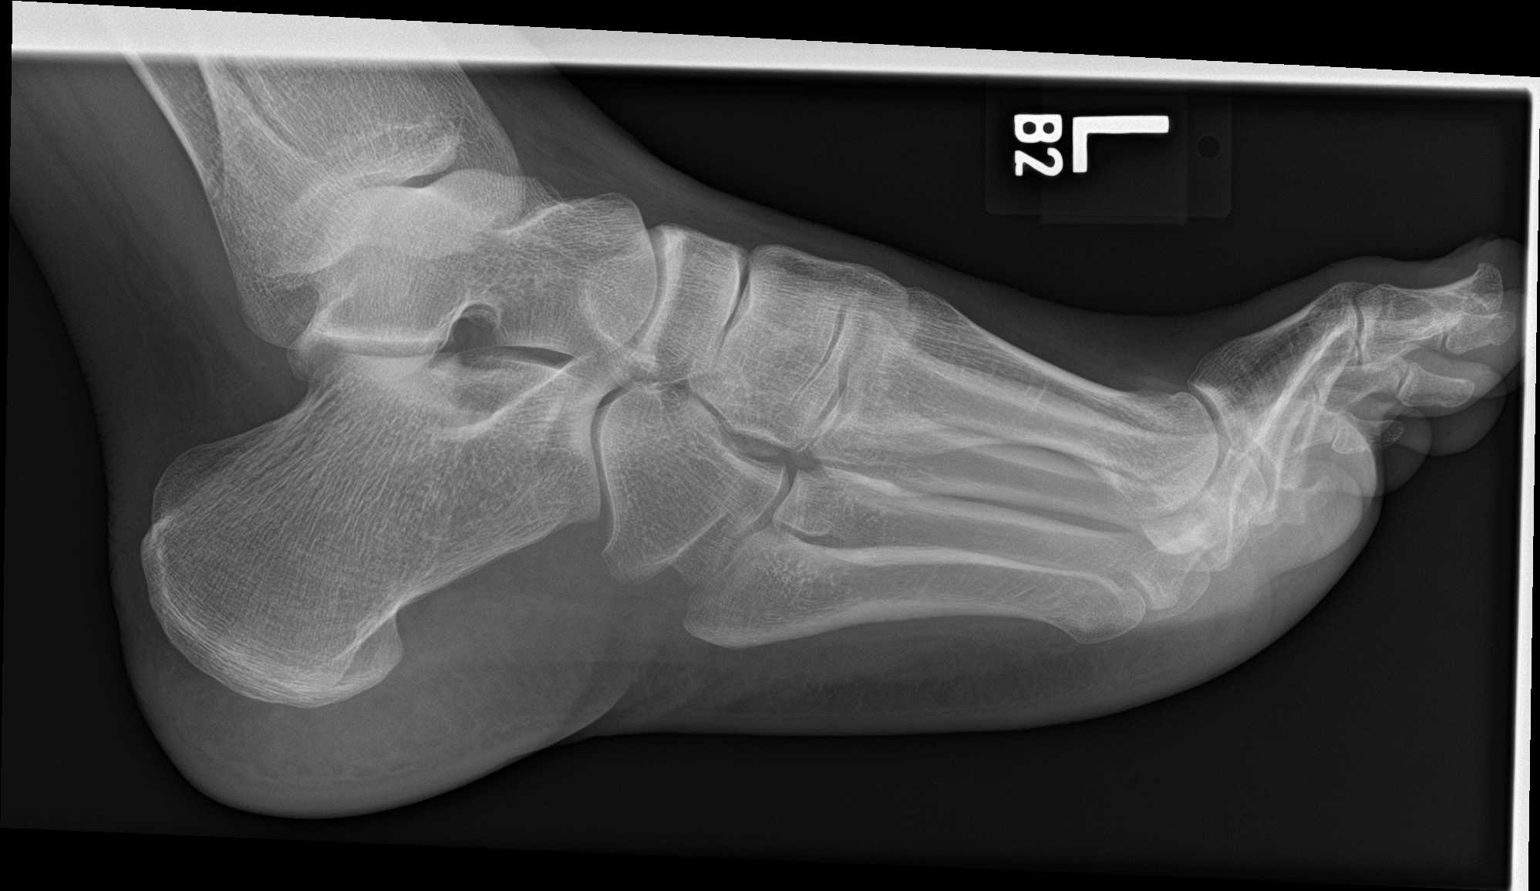

[2 of 2 positions shown; findings below may reference images not displayed]

FINDINGS: No acute fracture. No dislocation.  Unremarkable soft tissues.
IMPRESSION: No acute bony pathology.

## 2018-07-03 ENCOUNTER — Encounter (HOSPITAL_COMMUNITY): Payer: Self-pay | Admitting: *Deleted

## 2018-07-03 ENCOUNTER — Other Ambulatory Visit: Payer: Self-pay

## 2018-07-03 ENCOUNTER — Emergency Department (HOSPITAL_COMMUNITY)
Admission: EM | Admit: 2018-07-03 | Discharge: 2018-07-03 | Disposition: A | Discharge status: Home / Self Care | Payer: Self-pay | Attending: Emergency Medicine | Admitting: Emergency Medicine

## 2018-07-03 ENCOUNTER — Emergency Department (HOSPITAL_COMMUNITY): Payer: Self-pay

## 2018-07-03 DIAGNOSIS — F17210 Nicotine dependence, cigarettes, uncomplicated: Secondary | ICD-10-CM | POA: Insufficient documentation

## 2018-07-03 DIAGNOSIS — M79671 Pain in right foot: Secondary | ICD-10-CM | POA: Insufficient documentation

## 2018-07-03 DIAGNOSIS — J449 Chronic obstructive pulmonary disease, unspecified: Secondary | ICD-10-CM | POA: Insufficient documentation

## 2018-07-03 MED ORDER — NAPROXEN 500 MG PO TABS: 500.0000 mg | ORAL_TABLET | Freq: Two times a day (BID) | ORAL | 0 refills | Status: DC

## 2018-07-03 NOTE — Progress Notes (Signed)
Orthopedic Tech Progress Note Patient Details:  Kristopher Kim 05/18/1986 [id number]  Ortho Devices Type of Ortho Device: CAM walker Ortho Device/Splint Location: rle Ortho Device/Splint Interventions: Application   Post Interventions Patient Tolerated: Well Instructions Provided: Care of device   [name] 07/03/2018, 5:51 PM  

## 2018-07-03 NOTE — ED Notes (Signed)
Pt transported to xray 

## 2018-07-03 NOTE — ED Triage Notes (Signed)
Pt in c/o right foot pain that has been going on for months and keeps getting progressively worse

## 2018-07-03 NOTE — ED Provider Notes (Signed)
[facility] Provider Note   CSN: [id number] Arrival date & time: 07/03/18  1552     History   Chief Complaint Chief Complaint  Patient presents with  . Foot Pain    HPI Kristopher Kim is a 32 y.o. male who presents emergency department today for right foot pain that is been ongoing for the last several months.  Patient reports that his job requires him to wear steel toed boots.  He notes he has been wearing them increasingly over the last several months.  He reports that he is being having pain on his third and fourth digit that extends from the top of his foot to the bottom of his foot.  He denies any pain in his arch or his heel.  He denies any trauma.  He has been trying massages as well as ice with mild relief of his symptoms but notes that it does return the next day.  He has not been taking any medication for his symptoms.  He reports that walking makes his symptoms worse.  Patient denies fever, joint swelling, overlying erythema, breaks of the skin, numbness/tingling/weakness.  HPI  Past Medical History:  Diagnosis Date  . COPD (chronic obstructive pulmonary disease) (HCC)     There are no active problems to display for this patient.   History reviewed. No pertinent surgical history.      Home Medications    Prior to Admission medications   Not on File    Family History History reviewed. No pertinent family history.  Social History Social History   Tobacco Use  . Smoking status: Current Every Day Smoker  . Smokeless tobacco: Current User  Substance Use Topics  . Alcohol use: Not on file  . Drug use: Not on file     Allergies   Ceclor [cefaclor] and Penicillins   Review of Systems Review of Systems  All other systems reviewed and are negative.    Physical Exam Updated Vital Signs BP 118/73 (BP Location: Right Arm)   Pulse 65   Temp 98.3 F (36.8 C) (Oral)   Resp 16   SpO2 98%   Physical Exam    Constitutional: He appears well-developed and well-nourished.  HENT:  Head: Normocephalic and atraumatic.  Right Ear: External ear normal.  Left Ear: External ear normal.  Eyes: Conjunctivae are normal. Right eye exhibits no discharge. Left eye exhibits no discharge. No scleral icterus.  Cardiovascular:  Pulses:      Dorsalis pedis pulses are 2+ on the right side, and 2+ on the left side.       Posterior tibial pulses are 2+ on the right side, and 2+ on the left side.  Lower extremity edema.  Calves are symmetric in size bilaterally.  Negative Homans test on the right.  Pulmonary/Chest: Effort normal. No respiratory distress.  Musculoskeletal:       Right ankle: Normal.       Feet:  Patient with noted hammertoes of second through fifth digits on the right foot.  He has tenderness of the third and fourth digits.  There is no breaks of the skin.  No joint swelling, overlying erythema or decreased range of motion.  No tenderness palpation over arch or heel.  No tenderness palpation of Achilles.  Patient with normal range of motion.  Compartments are soft.  He is neurovascular intact.  Neurological: He is alert. He has normal strength. No sensory deficit.  Skin: Skin is warm and dry. Capillary refill  takes less than 2 seconds. No erythema. No pallor.  Psychiatric: He has a normal mood and affect.  Nursing note and vitals reviewed.    ED Treatments / Results  Labs (all labs ordered are listed, but only abnormal results are displayed) Labs Reviewed - No data to display  EKG None  Radiology Dg Foot Complete Right  Result Date: 07/03/2018 CLINICAL DATA:  Chronic progressive RIGHT foot pain localizing to the plantar surface. No known injuries. EXAM: RIGHT FOOT COMPLETE - 3+ VIEW COMPARISON:  None. FINDINGS: No evidence of acute, subacute or healed fractures. Apparent hammertoe deformities. Well-preserved joint spaces. Well-preserved bone mineral density. No evidence of a plantar calcaneal  spur. Mild soft tissue swelling involving the plantar surface underlying the metatarsal heads. IMPRESSION: 1. Mild soft tissue swelling involving the plantar surface underlying the metatarsal heads. 2. No evidence of plantar calcaneal spur. 3. Apparent hammertoe deformities. No osseous abnormalities otherwise. Electronically Signed   By: Hulan Saas M.D.   On: 07/03/2018 17:04    Procedures Procedures (including critical care time)  Medications Ordered in ED Medications - No data to display   Initial Impression / Assessment and Plan / ED Course  I have reviewed the triage vital signs and the nursing notes.  Pertinent labs & imaging results that were available during my care of the patient were reviewed by me and considered in my medical decision making (see chart for details).     32 y.o. male with right foot pain. No evidence of septic joint. Patient is without fever, erythema, joint swelling or decreased rom. Patient X-Ray negative for obvious fracture or dislocation.  Pt advised to follow up with orthopedics/podiatry if symptoms persist for possibility of missed fracture diagnosis. Patient given brace while in ED, conservative therapy recommended and discussed. Return precautions discussed. Patient will be dc home & is agreeable with above plan.  Final Clinical Impressions(s) / ED Diagnoses   Final diagnoses:  Right foot pain    ED Discharge Orders        Ordered    naproxen (NAPROSYN) 500 MG tablet  2 times daily     07/03/18 1730       Princella Pellegrini 07/03/18 1731    Melene Plan, DO 07/03/18 2012

## 2018-07-03 NOTE — Discharge Instructions (Addendum)
Please read and follow all provided instructions.  You have been seen today for right foot pain  Tests performed today include: An x-ray of the affected area - does NOT show any broken bones or dislocations.  Vital signs. See below for your results today.   Home care instructions: -- *PRICE in the first 24-48 hours after injury: Protect (with brace, splint, sling), if given by your provider Rest Ice- Do not apply ice pack directly to your skin, place towel or similar between your skin and ice/ice pack. Apply ice for 20 min, then remove for 40 min while awake Compression- Wear brace, elastic bandage, splint as directed by your provider Elevate affected extremity above the level of your heart when not walking around for the first 24-48 hours   Use Naproxen with food as directed.   Follow-up instructions: Please follow-up with your primary care provider or the provided orthopedic physician (bone specialist) if you continue to have significant pain in 1 week. In this case you may have a more severe injury that requires further care.   Return instructions:  Please return if your toes or feet are numb or tingling, appear gray or blue, or you have severe pain (also elevate the leg and loosen splint or wrap if you were given one) Please return to the Emergency Department if you experience worsening symptoms.  Please return if you have any other emergent concerns. Additional Information:  Your vital signs today were: BP 118/73 (BP Location: Right Arm)    Pulse 65    Temp 98.3 F (36.8 C) (Oral)    Resp 16    SpO2 98%  If your blood pressure (BP) was elevated above 135/85 this visit, please have this repeated by your doctor within one month. ---------------

## 2018-07-03 NOTE — ED Notes (Signed)
Declined W/C at D/C and was escorted to lobby by RN. 

## 2018-07-03 NOTE — Progress Notes (Signed)
Orthopedic Tech Progress Note Patient Details:  Kristopher Kim 06/06/1986 [id number]  Ortho Devices Type of Ortho Device: CAM walker Ortho Device/Splint Location: rle Ortho Device/Splint Interventions: Application   Post Interventions Patient Tolerated: Well Instructions Provided: Care of device   Kristopher Kim, Kristopher Kim 07/03/2018, 5:51 PM

## 2018-07-04 ENCOUNTER — Encounter (HOSPITAL_COMMUNITY): Payer: Self-pay | Admitting: Emergency Medicine

## 2019-04-06 ENCOUNTER — Other Ambulatory Visit: Payer: Self-pay

## 2019-04-06 ENCOUNTER — Emergency Department (HOSPITAL_COMMUNITY)
Admission: EM | Admit: 2019-04-06 | Discharge: 2019-04-06 | Disposition: A | Discharge status: Home / Self Care | Payer: Self-pay | Attending: Emergency Medicine | Admitting: Emergency Medicine

## 2019-04-06 ENCOUNTER — Encounter (HOSPITAL_COMMUNITY): Payer: Self-pay | Admitting: Emergency Medicine

## 2019-04-06 DIAGNOSIS — X58XXXA Exposure to other specified factors, initial encounter: Secondary | ICD-10-CM | POA: Insufficient documentation

## 2019-04-06 DIAGNOSIS — J449 Chronic obstructive pulmonary disease, unspecified: Secondary | ICD-10-CM | POA: Insufficient documentation

## 2019-04-06 DIAGNOSIS — Y990 Civilian activity done for income or pay: Secondary | ICD-10-CM | POA: Insufficient documentation

## 2019-04-06 DIAGNOSIS — S0502XA Injury of conjunctiva and corneal abrasion without foreign body, left eye, initial encounter: Secondary | ICD-10-CM | POA: Insufficient documentation

## 2019-04-06 DIAGNOSIS — F17210 Nicotine dependence, cigarettes, uncomplicated: Secondary | ICD-10-CM | POA: Insufficient documentation

## 2019-04-06 DIAGNOSIS — Y9389 Activity, other specified: Secondary | ICD-10-CM | POA: Insufficient documentation

## 2019-04-06 DIAGNOSIS — F319 Bipolar disorder, unspecified: Secondary | ICD-10-CM | POA: Insufficient documentation

## 2019-04-06 DIAGNOSIS — Y929 Unspecified place or not applicable: Secondary | ICD-10-CM | POA: Insufficient documentation

## 2019-04-06 MED ORDER — ERYTHROMYCIN 5 MG/GM OP OINT: TOPICAL_OINTMENT | OPHTHALMIC | 0 refills | Status: DC

## 2019-04-06 MED ORDER — FLUORESCEIN SODIUM 1 MG OP STRP
1.0000 | ORAL_STRIP | Freq: Once | OPHTHALMIC | Status: AC
  Administered 2019-04-06: 1 via OPHTHALMIC
  Filled 2019-04-06: qty 1

## 2019-04-06 MED ORDER — TETRACAINE HCL 0.5 % OP SOLN
2.0000 [drp] | Freq: Once | OPHTHALMIC | Status: AC
  Administered 2019-04-06: 13:00:00 2 [drp] via OPHTHALMIC
  Filled 2019-04-06: qty 4

## 2019-04-06 NOTE — ED Provider Notes (Signed)
[facility] Provider Note   CSN: [id number] Arrival date & time: 04/06/19  1147    History   Chief Complaint Chief Complaint  Patient presents with  . Eye Pain    HPI Kristopher Kim is a 33 y.o. male.     The history is provided by the patient and medical records. No language interpreter was used.  Eye Pain    Kristopher Kim is a 33 y.o. male  with a PMH as listed below who presents to the Emergency Department complaining of left eye pain x 5 days. 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 There are frequently particles and dust floating around.  He did feel as if he got something in his eye.  He held the eyelid and splash some water in it.  Felt little better.  That night, he thought he may be developing a stye to the upper eyelid, when he woke the next day, he did not have a stye.  Has been having pain to the eye ever since which does get worse with light.  Yesterday, developed some blurry vision out of the left eye.    Past Medical History:  Diagnosis Date  . Bipolar 1 disorder (HCC)   . COPD (chronic obstructive pulmonary disease) (HCC)   . Traumatic brain injury (HCC)     There are no active problems to display for this patient.   History reviewed. No pertinent surgical history.      Home Medications    Prior to Admission medications   Medication Sig Start Date End Date Taking? Authorizing Provider  bacitracin-polymyxin b (POLYSPORIN) ophthalmic ointment Place 1 application into the left eye 3 (three) times daily. 11/06/16  Yes Raeford RazorKohut, Stephen, MD  budesonide-formoterol [facility](SYMBICORT) 80-4.5 MCG/ACT inhaler Inhale 1 puff into the lungs as needed (wheezing, shortness of breath).   Yes [provider]  ibuprofen (ADVIL,MOTRIN) 800 MG tablet Take 1 tablet (800 mg total) by mouth every 8 (eight) hours as needed for mild pain or moderate pain. 07/10/14  Yes West, Emily, PA-C  cephALEXin (KEFLEX)  500 MG capsule Take 1 capsule (500 mg total) by mouth 3 (three) times daily. Patient not taking: Reported on 06/19/2017 07/10/14   Trixie DredgeWest, Emily, PA-C  doxycycline (VIBRAMYCIN) 100 MG capsule Take 1 capsule (100 mg total) by mouth 2 (two) times daily. Patient not taking: Reported on 04/06/2019 09/12/17   Demetrios LollLeaphart, Kenneth T, PA-C  erythromycin ophthalmic ointment Place a 1/2 inch ribbon of ointment into the lower eyelid four times a day x 1 week. 04/06/19   Ward, Chase PicketJaime Pilcher, PA-C  HYDROcodone-acetaminophen (NORCO/VICODIN) 5-325 MG per tablet Take 1 tablet by mouth every 4 (four) hours as needed for moderate pain or severe pain. Patient not taking: Reported on 06/19/2017 07/10/14   Trixie DredgeWest, Emily, PA-C  naproxen (NAPROSYN) 500 MG tablet Take 1 tablet (500 mg total) by mouth 2 (two) times daily. Patient not taking: Reported on 04/06/2019 08/06/17   Jacinto HalimMaczis, Michael M, PA-C  naproxen (NAPROSYN) 500 MG tablet Take 1 tablet (500 mg total) by mouth 2 (two) times daily. Patient not taking: Reported on 04/06/2019 07/03/18   Jacinto HalimMaczis, Michael M, PA-C  oxyCODONE-acetaminophen (PERCOCET/ROXICET) 5-325 MG per tablet Take 1-2 tablets by mouth every 4 (four) hours as needed for severe pain. Patient not taking: Reported on 06/19/2017 10/27/14   Mirian MoGentry, Matthew, MD    Family History No family history on file.  Social History Social History   Tobacco  Use  . Smoking status: Current Every Day Smoker    Packs/day: 0.50    Types: Cigarettes  . Smokeless tobacco: Current User  Substance Use Topics  . Alcohol use: No    Comment: quit 2016  . Drug use: Yes    Types: Marijuana     Allergies   Ceclor [cefaclor]; Ceclor [cefaclor]; Penicillins; and Penicillins   Review of Systems Review of Systems  Constitutional: Negative for fever.  HENT: Negative for congestion and sore throat.   Eyes: Positive for photophobia, pain, redness and visual disturbance (Blurry vision of the left eye).     Physical Exam Updated  Vital Signs BP (!) 122/96 (BP Location: Right Arm)   Pulse 73   Temp 98.6 F (37 C) (Oral)   Resp 17   SpO2 99%   Physical Exam Vitals signs and nursing note reviewed.  Constitutional:      General: He is not in acute distress.    Appearance: He is well-developed.  HENT:     Head: Normocephalic and atraumatic.  Eyes:     General: Lids are normal. Lids are everted, no foreign bodies appreciated. Vision grossly intact.        Left eye: No foreign body, discharge or hordeolum.     Intraocular pressure: Right eye pressure is 17 mmHg. Left eye pressure is 20 mmHg.     Extraocular Movements: Extraocular movements intact.     Conjunctiva/sclera:     Left eye: Left conjunctiva is injected.     Pupils: Pupils are equal, round, and reactive to light.     Left eye: Corneal abrasion and fluorescein uptake present. Seidel exam negative. Neck:     Musculoskeletal: Neck supple.  Cardiovascular:     Rate and Rhythm: Normal rate and regular rhythm.     Heart sounds: Normal heart sounds. No murmur.  Pulmonary:     Effort: Pulmonary effort is normal. No respiratory distress.     Breath sounds: Normal breath sounds. No wheezing or rales.  Musculoskeletal: Normal range of motion.  Skin:    General: Skin is warm and dry.  Neurological:     Mental Status: He is alert.      ED Treatments / Results  Labs (all labs ordered are listed, but only abnormal results are displayed) Labs Reviewed - No data to display  EKG None  Radiology No results found.  Procedures Procedures (including critical care time)  Medications Ordered in ED Medications  fluorescein ophthalmic strip 1 strip (has no administration in time range)  tetracaine (PONTOCAINE) 0.5 % ophthalmic solution 2 drop (has no administration in time range)     Initial Impression / Assessment and Plan / ED Course  I have reviewed the triage vital signs and the nursing notes.  Pertinent labs & imaging results that were available  during my care of the patient were reviewed by me and considered in my medical decision making (see chart for details).       Hamze Aubrey is a 33 y.o. male who presents to ED for left eye pain for the last several days.  He works in a [employer] and felt as if something got in his eye at work at onset.  Eyelid inverted, I flushed thoroughly here in the emergency department and no signs of foreign body appreciated.  He does have fluorescein uptake consistent with corneal abrasion on the left medial aspect of the cornea.  Visual acuity performed and reassuring.  IOP's normal.  Will treat  with erythromycin ointment and have him follow-up with ophthalmology.  Discussed reasons to return to ER.  All questions answered.  Patient discussed with Dr. Rodena Medin who agrees with treatment plan.   Final Clinical Impressions(s) / ED Diagnoses   Final diagnoses:  Abrasion of left cornea, initial encounter    ED Discharge Orders         Ordered    erythromycin ophthalmic ointment     04/06/19 1241           Ward, Chase Picket, PA-C 04/06/19 1300    Wynetta Fines, MD 04/06/19 [phone]

## 2019-04-06 NOTE — Discharge Instructions (Signed)
It was my pleasure taking care of you today!   Apply antibiotic ointment to the left eye as directed.   Call the eye doctor listed tomorrow morning to schedule a follow up appointment.   Return to ER for new or worsening symptoms, any additional concerns.

## 2019-04-06 NOTE — ED Triage Notes (Signed)
Pt with left eye pain, redness, drainage, and photosensitive. He is unsure what could have caused it. Denies fever but has runny nose. He used saline eye wash and pink eye relief.

## 2019-11-12 ENCOUNTER — Ambulatory Visit: Payer: Self-pay | Attending: Family Medicine | Admitting: Family Medicine

## 2019-11-12 ENCOUNTER — Encounter: Payer: Self-pay | Admitting: Family Medicine

## 2019-11-12 ENCOUNTER — Other Ambulatory Visit: Payer: Self-pay

## 2019-11-12 ENCOUNTER — Encounter: Payer: Self-pay | Admitting: *Deleted

## 2019-11-12 DIAGNOSIS — K4090 Unilateral inguinal hernia, without obstruction or gangrene, not specified as recurrent: Secondary | ICD-10-CM

## 2019-11-12 DIAGNOSIS — J449 Chronic obstructive pulmonary disease, unspecified: Secondary | ICD-10-CM

## 2019-11-12 NOTE — Progress Notes (Signed)
Virtual Visit via Telephone Note  I connected with Kristopher Kim on 11/12/19 at  9:30 AM EST by telephone and verified that I am speaking with the correct person using two identifiers.   I discussed the limitations, risks, security and privacy concerns of performing an evaluation and management service by telephone and the availability of in person appointments. I also discussed with the patient that there may be a patient responsible charge related to this service. The patient expressed understanding and agreed to proceed.  Patient Location: Home Provider Location: [facility] Others participating in call: none   History of Present Illness:      33 yo male male who is new to the practice.  He reports that for the past 2-1/2 to 3 years he has had a hernia in the left groin area.  He reports that he can sometimes feel the hernia bulging into his left scrotal sac.  Patient currently works in [employer] and occasionally when lifting heavy objects he will have acute pain in this area.  Pain is sharp and is about a 6 on a 0-to-10 scale.  Pain generally resolves within an an hour to 2 hours.  He believes that his hernia has gradually increased in size and he would like to have treatment for the hernia.        Patient also has history of COPD.  He does continue to smoke.  He denies any current shortness of breath or cough.  He is not interested in smoking cessation at this time.  He does not wish to have an influenza immunization.  He reports that he currently takes Symbicort on an as-needed basis when he has acute shortness of breath.  He prefers to take the medication this way rather than on a daily basis.  He reports that once in the past when he was given an albuterol treatment he coughed up some blood the next day and states that he does not wish to be on albuterol.  He is not interested in a referral to pulmonology in follow-up of COPD.           He denies any past surgeries.  Family history is  significant for paternal grandmother with Parkinson's disease and paternal grandfather with heart attack and stroke.          On review of systems, he denies any issues with headaches or dizziness, no recent fever or chills, no sore throat or difficulty swallowing.  No chest pain or palpitations.  No abdominal pain-no nausea or vomiting.  No urinary frequency, urgency or dysuria.          Past Medical History:  Diagnosis Date  . Bipolar 1 disorder (HCC)   . COPD (chronic obstructive pulmonary disease) (HCC)   . Traumatic brain injury [facility])     Past Surgical History:  Procedure Laterality Date  . NO PAST SURGERIES      Family History  Problem Relation Age of Onset  . Parkinson's disease Paternal Grandmother   . Heart attack Paternal Grandfather   . Stroke Paternal Grandfather   . Cancer Neg Hx     Social History   Tobacco Use  . Smoking status: Current Every Day Smoker    Packs/day: 0.50    Types: Cigarettes  . Smokeless tobacco: Current User  Substance Use Topics  . Alcohol use: No    Comment: quit 2016  . Drug use: Yes    Types: Marijuana     Allergies  Allergen Reactions  .  Ceclor [Cefaclor] Hives  . Ceclor [Cefaclor]   . Penicillins Hives  . Penicillins        Observations/Objective: No vital signs or physical exam conducted as visit was done via telephone  Assessment and Plan: 1. Left inguinal hernia Referral will be placed to surgery in follow-up of patient's left inguinal hernia.  Patient reports that he currently has insurance through [employer] and [employer].  He has made aware that if he has severe onset of pain that is not decreasing that he may need to go to the emergency department for further evaluation as this could represent a medical emergency.  2. Chronic obstructive pulmonary disease, unspecified COPD type ([place]) Discussed with patient that Symbicort is a daily medication to help reduce COPD symptoms by calming lung inflammation and spasm.   Patient however states that he prefers to take the Symbicort as needed and finds that this does tend to help.  Patient reports prior bad experience with the use of albuterol and therefore he does not want a prescription for this medication to use on an as-needed basis for acute symptoms.  He does not wish to have an influenza immunization.  Patient was offered appointment with clinical pharmacist regarding smoking cessation which she declines at this time.  Follow Up Instructions:    I discussed the assessment and treatment plan with the patient. The patient was provided an opportunity to ask questions and all were answered. The patient agreed with the plan and demonstrated an understanding of the instructions.   The patient was advised to call back or seek an in-person evaluation if the symptoms worsen or if the condition fails to improve as anticipated.  I provided 17 minutes of non-face-to-face time during this encounter.   Antony Blackbird, MD

## 2019-11-12 NOTE — Progress Notes (Signed)
Patient verified DOB Patient has taken medication today. Patient has eaten today. Patient denies pain at this time. Patient states he noticed a hernia in the groin 3 years ago. Patient states over the 3 years it has gradually grown in size. Patient states he can feel it now in his scrotum. Patient states he chaffs on the left side from the skin rubbing.

## 2019-11-25 ENCOUNTER — Other Ambulatory Visit: Payer: Self-pay

## 2019-11-25 ENCOUNTER — Ambulatory Visit (HOSPITAL_COMMUNITY)
Admission: EM | Admit: 2019-11-25 | Discharge: 2019-11-25 | Disposition: A | Discharge status: Home / Self Care | Payer: Self-pay | Attending: Family Medicine | Admitting: Family Medicine

## 2019-11-25 ENCOUNTER — Encounter (HOSPITAL_COMMUNITY): Payer: Self-pay

## 2019-11-25 DIAGNOSIS — M654 Radial styloid tenosynovitis [de Quervain]: Secondary | ICD-10-CM
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
## 2019-11-25 NOTE — ED Provider Notes (Signed)
[facility] CARE CENTER   [id number] 11/25/19 Arrival Time: 1629  ASSESSMENT & PLAN:  1. Tendinitis, de Quervain's     No indication for wrist imaging at this time.  Prefers ibuprofen TID with food.  Orders Placed This Encounter  Procedures  . Thumb spica  To wear for the next one week. Remove at times to work on ROM.  Recommend: Follow-up Information    [facility].   Why: If worsening or failing to improve as anticipated. Contact information: [address] [place] [address] [phone]          Reviewed expectations re: course of current medical issues. Questions answered. Outlined signs and symptoms indicating need for more acute intervention. Patient verbalized understanding. After Visit Summary given.  SUBJECTIVE: History from: patient. Kristopher Kim is a 33 y.o. male who reports fairly persistent moderate pain of his left radial wrist; described as aching and with occasional sharp pains; questions sporadic radiation to base of thumb. Onset: gradual. First noted: a week ago. 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 Symptoms have progressed to a point and plateaued since beginning. Aggravating factors: certain movements and grasping. Alleviating factors: rest. Associated symptoms: none reported. Extremity sensation changes or weakness: none. Self treatment: has not tried OTC therapies.  History of similar: no.  Past Surgical History:  Procedure Laterality Date  . NO PAST SURGERIES       ROS: As per HPI. All other systems negative.    OBJECTIVE:  Vitals:   11/25/19 1657  BP: 140/75  Pulse: 90  Resp: 16  Temp: 98 F (36.7 C)  TempSrc: Oral  SpO2: 99%    General appearance: alert; no distress HEENT: [place]; AT Neck: supple with FROM Resp: unlabored respirations Extremities: . LUE: warm with well perfused appearance; pain  reported over radial side of wrist, more notable with thumb and wrist movement; no erythema or inflammation; no swelling; FROM; very tender over radial styloid CV: brisk extremity capillary refill of RUE; 2+ radial pulse of RUE. Skin: warm and dry; no visible rashes Neurologic: gait normal; normal reflexes of RUE; normal sensation of RUE; normal strength of RUE Psychological: alert and cooperative; normal mood and affect    Allergies  Allergen Reactions  . Ceclor [Cefaclor] Hives  . Ceclor [Cefaclor]   . Penicillins Hives  . Penicillins     Past Medical History:  Diagnosis Date  . Bipolar 1 disorder (HCC)   . COPD (chronic obstructive pulmonary disease) (HCC)   . Traumatic brain injury [facility])    Social History   Socioeconomic History  . Marital status: Single    Spouse name: Not on file  . Number of children: Not on file  . Years of education: Not on file  . Highest education level: Not on file  Occupational History  . Not on file  Tobacco Use  . Smoking status: Current Every Day Smoker    Packs/day: 0.50    Types: Cigarettes  . Smokeless tobacco: Current User  Substance and Sexual Activity  . Alcohol use: No    Comment: quit 2016  . Drug use: Yes    Types: Marijuana  . Sexual activity: Yes  Other Topics Concern  . Not on file  Social History Narrative   ** Merged History Encounter **       Social Determinants of Health   Financial Resource Strain:   . Difficulty of Paying Living  Expenses: Not on file  Food Insecurity:   . Worried About Charity fundraiser in the Last Year: Not on file  . Ran Out of Food in the Last Year: Not on file  Transportation Needs:   . Lack of Transportation (Medical): Not on file  . Lack of Transportation (Non-Medical): Not on file  Physical Activity:   . Days of Exercise per Week: Not on file  . Minutes of Exercise per Session: Not on file  Stress:   . Feeling of Stress : Not on file  Social Connections:   . Frequency of  Communication with Friends and Family: Not on file  . Frequency of Social Gatherings with Friends and Family: Not on file  . Attends Religious Services: Not on file  . Active Member of Clubs or Organizations: Not on file  . Attends Archivist Meetings: Not on file  . Marital Status: Not on file   Family History  Problem Relation Age of Onset  . Parkinson's disease Paternal Grandmother   . Heart attack Paternal Grandfather   . Stroke Paternal Grandfather   . Healthy Mother   . Healthy Father   . Cancer Neg Hx    Past Surgical History:  Procedure Laterality Date  . NO PAST SURGERIES        Vanessa Kick, MD 11/26/19 1008

## 2019-12-16 ENCOUNTER — Telehealth: Payer: Self-pay | Admitting: Family Medicine

## 2019-12-16 NOTE — Telephone Encounter (Signed)
Pt called to request an update on his referral sent on 11/12/2019. Please follow up as soon as possible

## 2019-12-16 NOTE — Telephone Encounter (Signed)
Unable to contact patient  Lvm to call me  back  but  on 11/12/2020 I mailed him a letter with cafa application  to apply so I can refer him to General Surgery . Thank you .

## 2020-04-29 ENCOUNTER — Ambulatory Visit (HOSPITAL_COMMUNITY)
Admission: EM | Admit: 2020-04-29 | Discharge: 2020-04-29 | Disposition: A | Discharge status: Home / Self Care | Payer: Commercial Managed Care - PPO | Attending: Internal Medicine | Admitting: Internal Medicine

## 2020-04-29 ENCOUNTER — Encounter (HOSPITAL_COMMUNITY): Payer: Self-pay

## 2020-04-29 ENCOUNTER — Other Ambulatory Visit: Payer: Self-pay

## 2020-04-29 DIAGNOSIS — A084 Viral intestinal infection, unspecified: Secondary | ICD-10-CM

## 2020-04-29 LAB — CBC WITH DIFFERENTIAL/PLATELET
Abs Immature Granulocytes: 0.03 10*3/uL (ref 0.00–0.07)
Basophils Absolute: 0.1 10*3/uL (ref 0.0–0.1)
Basophils Relative: 1 %
Eosinophils Absolute: 0.3 10*3/uL (ref 0.0–0.5)
Eosinophils Relative: 5 %
HCT: 50.7 % (ref 39.0–52.0)
Hemoglobin: 17.4 g/dL — ABNORMAL HIGH (ref 13.0–17.0)
Immature Granulocytes: 0 %
Lymphocytes Relative: 21 %
Lymphs Abs: 1.5 10*3/uL (ref 0.7–4.0)
MCH: 33 pg (ref 26.0–34.0)
MCHC: 34.3 g/dL (ref 30.0–36.0)
MCV: 96.2 fL (ref 80.0–100.0)
Monocytes Absolute: 0.8 10*3/uL (ref 0.1–1.0)
Monocytes Relative: 12 %
Neutro Abs: 4.4 10*3/uL (ref 1.7–7.7)
Neutrophils Relative %: 61 %
Platelets: 155 10*3/uL (ref 150–400)
RBC: 5.27 MIL/uL (ref 4.22–5.81)
RDW: 12.5 % (ref 11.5–15.5)
WBC: 7.1 10*3/uL (ref 4.0–10.5)
nRBC: 0 % (ref 0.0–0.2)

## 2020-04-29 LAB — COMPREHENSIVE METABOLIC PANEL
ALT: 19 U/L (ref 0–44)
AST: 29 U/L (ref 15–41)
Albumin: 4.8 g/dL (ref 3.5–5.0)
Alkaline Phosphatase: 68 U/L (ref 38–126)
Anion gap: 8 (ref 5–15)
BUN: 18 mg/dL (ref 6–20)
CO2: 25 mmol/L (ref 22–32)
Calcium: 9.8 mg/dL (ref 8.9–10.3)
Chloride: 105 mmol/L (ref 98–111)
Creatinine, Ser: 0.94 mg/dL (ref 0.61–1.24)
GFR calc Af Amer: >60 mL/min (ref >60)
GFR calc non Af Amer: >60 mL/min (ref >60)
Glucose, Bld: 98 mg/dL (ref 70–99)
Potassium: 5.1 mmol/L (ref 3.5–5.1)
Sodium: 138 mmol/L (ref 135–145)
Total Bilirubin: 1 mg/dL (ref 0.3–1.2)
Total Protein: 8.4 g/dL — ABNORMAL HIGH (ref 6.5–8.1)

## 2020-04-29 MED ORDER — ONDANSETRON 4 MG PO TBDP: 4.0000 mg | ORAL_TABLET | Freq: Three times a day (TID) | ORAL | 0 refills | Status: DC | PRN

## 2020-04-29 MED ORDER — DIPHENOXYLATE-ATROPINE 2.5-0.025 MG PO TABS: 1.0000 | ORAL_TABLET | Freq: Four times a day (QID) | ORAL | 0 refills | Status: DC | PRN

## 2020-04-29 NOTE — ED Provider Notes (Signed)
[place]    CSN: [id number] Arrival date & time: 04/29/20  1204      History   Chief Complaint Chief Complaint  Patient presents with  . NVD    HPI Kristopher Kim is a 34 y.o. male comes to urgent care with complaints of nausea, vomiting and diarrhea of 2 days duration.  Patient symptoms started about 2 days ago with non-bilious nonbloody vomiting.  Subsequently patient developed some diarrhea.  He has had about 6 episodes of nonmucoid nonbloody diarrhea today.  Is associated with crampy abdominal pain.  He had some subjective fever at the onset of the symptoms.  No runny nose, loss of taste or smell.  Patient's wife had similar symptoms.  Patient recently organized her birthday party for his 83-year-old son and had people from several states come to the birthday party.  No change in diet. HPI  Past Medical History:  Diagnosis Date  . Bipolar 1 disorder ([place])   . COPD (chronic obstructive pulmonary disease) ([place])   . Traumatic brain injury ([place])     There are no problems to display for this patient.   Past Surgical History:  Procedure Laterality Date  . NO PAST SURGERIES         Home Medications    Prior to Admission medications   Medication Sig Start Date End Date Taking? Authorizing Provider  budesonide-formoterol (SYMBICORT) 80-4.5 MCG/ACT inhaler Inhale 1 puff into the lungs as needed (wheezing, shortness of breath).    [provider]  diphenoxylate-atropine (LOMOTIL) 2.5-0.025 MG tablet Take 1 tablet by mouth 4 (four) times daily as needed for diarrhea or loose stools. 04/29/20   Chase Picket, MD  ondansetron (ZOFRAN ODT) 4 MG disintegrating tablet Take 1 tablet (4 mg total) by mouth every 8 (eight) hours as needed for nausea or vomiting. 04/29/20   [place] Brick, Myrene Galas, MD    Family History Family History  Problem Relation Age of Onset  . Parkinson's disease Paternal Grandmother   . Heart attack Paternal Grandfather   . Stroke  Paternal Grandfather   . Healthy Mother   . Healthy Father   . Cancer Neg Hx     Social History Social History   Tobacco Use  . Smoking status: Current Every Day Smoker    Packs/day: 0.50    Types: Cigarettes  . Smokeless tobacco: Current User  Substance Use Topics  . Alcohol use: No    Comment: quit 2016  . Drug use: Yes    Types: Marijuana     Allergies   Ceclor [cefaclor], Ceclor [cefaclor], Penicillins, and Penicillins   Review of Systems Review of Systems  Constitutional: Negative for activity change, chills, fatigue and fever.  Respiratory: Negative.   Cardiovascular: Negative.   Gastrointestinal: Positive for abdominal pain, diarrhea, nausea and vomiting.  Genitourinary: Negative.   Musculoskeletal: Negative.   Skin: Negative.   Neurological: Positive for headaches. Negative for dizziness and light-headedness.     Physical Exam Triage Vital Signs ED Triage Vitals  Enc Vitals Group     BP 04/29/20 1249 109/75     Pulse Rate 04/29/20 1249 60     Resp 04/29/20 1249 18     Temp 04/29/20 1249 98.5 F (36.9 C)     Temp Source 04/29/20 1249 Oral     SpO2 04/29/20 1249 100 %     Weight --      Height --      Head Circumference --  Peak Flow --      Pain Score 04/29/20 1246 0     Pain Loc --      Pain Edu? --      Excl. in GC? --    No data found.  Updated Vital Signs BP 109/75 (BP Location: Right Arm)   Pulse 60   Temp 98.5 F (36.9 C) (Oral)   Resp 18   SpO2 100%   Visual Acuity Right Eye Distance:   Left Eye Distance:   Bilateral Distance:    Right Eye Near:   Left Eye Near:    Bilateral Near:     Physical Exam   UC Treatments / Results  Labs (all labs ordered are listed, but only abnormal results are displayed) Labs Reviewed  CBC WITH DIFFERENTIAL/PLATELET - Abnormal; Notable for the following components:      Result Value   Hemoglobin 17.4 (*)    All other components within normal limits  COMPREHENSIVE METABOLIC PANEL -  Abnormal; Notable for the following components:   Total Protein 8.4 (*)    All other components within normal limits    EKG   Radiology No results found.  Procedures Procedures (including critical care time)  Medications Ordered in UC Medications - No data to display  Initial Impression / Assessment and Plan / UC Course  I have reviewed the triage vital signs and the nursing notes.  Pertinent labs & imaging results that were available during my care of the patient were reviewed by me and considered in my medical decision making (see chart for details).     1.  Viral gastroenteritis: Patient is advised to push fluids Zofran as needed for nausea vomiting Lomotil as needed for diarrhea Return precautions given Patient had COVID-19 testing done in the outpatient setting as part of preop assessment-we will not repeat COVID-19 testing here. Patient is advised to quarantine until COVID-19 test results are available. Final Clinical Impressions(s) / UC Diagnoses   Final diagnoses:  Viral gastroenteritis   Discharge Instructions   None    ED Prescriptions    Medication Sig Dispense Auth. Provider   ondansetron (ZOFRAN ODT) 4 MG disintegrating tablet Take 1 tablet (4 mg total) by mouth every 8 (eight) hours as needed for nausea or vomiting. 20 tablet Annalicia Renfrew, Britta Mccreedy, MD   diphenoxylate-atropine (LOMOTIL) 2.5-0.025 MG tablet Take 1 tablet by mouth 4 (four) times daily as needed for diarrhea or loose stools. 30 tablet Sani Madariaga, Britta Mccreedy, MD     PDMP not reviewed this encounter.   Merrilee Jansky, MD 04/29/20 1459

## 2020-04-29 NOTE — ED Triage Notes (Addendum)
Pt is here with NVD that started yesterday, pt has taken Pepto to relieve discomfort. Pt was tested for COVID before coming here today he is having hernia surgery on Monday.

## 2020-11-16 ENCOUNTER — Other Ambulatory Visit: Payer: Self-pay

## 2020-11-16 ENCOUNTER — Ambulatory Visit: Payer: Commercial Managed Care - PPO | Attending: Physician Assistant | Admitting: Physician Assistant

## 2020-11-16 DIAGNOSIS — L309 Dermatitis, unspecified: Secondary | ICD-10-CM

## 2020-11-16 DIAGNOSIS — R222 Localized swelling, mass and lump, trunk: Secondary | ICD-10-CM | POA: Diagnosis not present

## 2020-11-16 MED ORDER — NYSTATIN-TRIAMCINOLONE 100000-0.1 UNIT/GM-% EX OINT: 1.0000 "application " | TOPICAL_OINTMENT | Freq: Two times a day (BID) | CUTANEOUS | 0 refills | Status: DC

## 2020-11-16 NOTE — Progress Notes (Signed)
Established Patient Office Visit  Subjective:  Patient ID: Kristopher Kim, male    DOB: May 18, 1986  Age: 34 y.o. MRN: [id number]  CC:  Chief Complaint  Patient presents with  . Lipoma   Virtual Visit via Telephone Note  I connected with Kristopher Kim on 11/17/20 at  3:50 PM EST by telephone and verified that I am speaking with the correct person using two identifiers.  Location: Patient: Home Provider: [facility]   I discussed the limitations, risks, security and privacy concerns of performing an evaluation and management service by telephone and the availability of in person appointments. I also discussed with the patient that there may be a patient responsible charge related to this service. The patient expressed understanding and agreed to proceed.   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  Denies any previous evaluation.  Reports that he has had a rash on his left foot, describes it as dry, little scaly patches, darker color than his skin, but blanches when he scratches them.  Has tried over-the-counter lotions and Vaseline without any relief.  Reports that this has been present for at least the past 3 years.      Observations/Objective: Medical history and current medications reviewed, no physical exam completed     Past Medical History:  Diagnosis Date  . Bipolar 1 disorder ([facility])   . COPD (chronic obstructive pulmonary disease) ([facility])   . Traumatic brain injury [facility])     Past Surgical History:  Procedure Laterality Date  . NO PAST SURGERIES      Family History  Problem Relation Age of Onset  . Parkinson's disease Paternal Grandmother   . Heart attack Paternal Grandfather   .  Stroke Paternal Grandfather   . Healthy Mother   . Healthy Father   . Cancer Neg Hx     Social History   Socioeconomic History  . Marital status: Married    Spouse name: Not on file  . Number of children: Not on file  . Years of education: Not on file  . Highest education level: Not on file  Occupational History  . Not on file  Tobacco Use  . Smoking status: Current Every Day Smoker    Packs/day: 0.50    Types: Cigarettes  . Smokeless tobacco: Current User  Vaping Use  . Vaping Use: Never used  Substance and Sexual Activity  . Alcohol use: No    Comment: quit 2016  . Drug use: Yes    Types: Marijuana  . Sexual activity: Yes    Birth control/protection: None    Comment: Married  Other Topics Concern  . Not on file  Social History Narrative   ** Merged History Encounter **       Social Determinants of Health   Financial Resource Strain:   . Difficulty of Paying Living Expenses: Not on file  Food Insecurity:   . Worried About Programme researcher, broadcasting/film/video in the Last Year: Not on file  . Ran Out of Food in the Last Year: Not on file  Transportation Needs:   . Lack of Transportation (Medical): Not on file  . Lack of Transportation (Non-Medical): Not on file  Physical Activity:   . Days of Exercise per Week: Not on file  . Minutes of Exercise per Session: Not on file  Stress:   . Feeling of Stress : Not on file  Social Connections:   . Frequency of Communication with Friends and Family: Not on file  . Frequency of Social Gatherings with Friends and Family: Not on file  . Attends Religious Services: Not on file  . Active Member of Clubs or Organizations: Not on file  . Attends Banker Meetings: Not on file  . Marital Status: Not on file  Intimate Partner Violence:   . Fear of Current or Ex-Partner: Not on file  . Emotionally Abused: Not on file  . Physically Abused: Not on file  . Sexually Abused: Not on file    Outpatient Medications Prior to Visit   Medication Sig Dispense Refill  . budesonide-formoterol (SYMBICORT) 80-4.5 MCG/ACT inhaler Inhale 1 puff into the lungs as needed (wheezing, shortness of breath).    . diphenoxylate-atropine (LOMOTIL) 2.5-0.025 MG tablet Take 1 tablet by mouth 4 (four) times daily as needed for diarrhea or loose stools. 30 tablet 0  . ondansetron (ZOFRAN ODT) 4 MG disintegrating tablet Take 1 tablet (4 mg total) by mouth every 8 (eight) hours as needed for nausea or vomiting. 20 tablet 0   No facility-administered medications prior to visit.    Allergies  Allergen Reactions  . Ceclor [Cefaclor] Hives  . Ceclor [Cefaclor]   . Penicillins Hives  . Penicillins     ROS Review of Systems  Constitutional: Negative.   HENT: Negative.   Eyes: Negative.   Respiratory: Negative.   Cardiovascular: Negative.   Gastrointestinal: Negative.   Endocrine: Negative.   Genitourinary: Negative.   Musculoskeletal: Negative for back pain.  Skin: Positive for rash.  Allergic/Immunologic: Negative.   Neurological: Negative.   Hematological: Negative.   Psychiatric/Behavioral: Negative.       Objective:     There were no vitals taken for this visit. Wt Readings from Last 3 Encounters:  01/24/18 190 lb (86.2 kg)  08/05/17 172 lb (78 kg)  07/28/17 170 lb (77.1 kg)     Health Maintenance Due  Topic Date Due  . Hepatitis C Screening  Never done  . COVID-19 Vaccine (1) Never done  . HIV Screening  Never done  . TETANUS/TDAP  Never done  . INFLUENZA VACCINE  Never done    There are no preventive care reminders to display for this patient.  No results found for: TSH Lab Results  Component Value Date   WBC 7.1 04/29/2020   HGB 17.4 (H) 04/29/2020   HCT 50.7 04/29/2020   MCV 96.2 04/29/2020   PLT 155 04/29/2020   Lab Results  Component Value Date   NA 138 04/29/2020   K 5.1 04/29/2020   CO2 25 04/29/2020   GLUCOSE 98 04/29/2020   BUN 18 04/29/2020   CREATININE 0.94 04/29/2020   BILITOT 1.0  04/29/2020   ALKPHOS 68 04/29/2020   AST 29 04/29/2020   ALT 19 04/29/2020   PROT 8.4 (H) 04/29/2020   ALBUMIN 4.8 04/29/2020   CALCIUM 9.8 04/29/2020   ANIONGAP 8 04/29/2020   No results found for: CHOL No results found for: HDL No results found for: LDLCALC No results found for: TRIG No results found for: CHOLHDL No results found for: IRJJ8A    Assessment & Plan:   Problem List Items Addressed This Visit      Musculoskeletal and Integument  Dermatitis - Primary   Relevant Medications   nystatin-triamcinolone ointment (MYCOLOG)     Other   Nodule of skin of back      Assessment and Plan:  1. Dermatitis Trial Mycolog, patient education given on keeping area clean and dry - nystatin-triamcinolone ointment (MYCOLOG); Apply 1 application topically 2 (two) times daily.  Dispense: 30 g; Refill: 0  2. Nodule of skin of back Will order ultrasound of nodule, consider referral to general surgery     Follow Up Instructions:    I discussed the assessment and treatment plan with the patient. The patient was provided an opportunity to ask questions and all were answered. The patient agreed with the plan and demonstrated an understanding of the instructions.   The patient was advised to call back or seek an in-person evaluation if the symptoms worsen or if the condition fails to improve as anticipated.  I provided 21 minutes of non-face-to-face time during this encounter.   Gaither Biehn S Mayers, PA-C Meds ordered this encounter  Medications  . nystatin-triamcinolone ointment (MYCOLOG)    Sig: Apply 1 application topically 2 (two) times daily.    Dispense:  30 g    Refill:  0    Order Specific Question:   Supervising Provider    Answer:   Storm Frisk [[id number]]    Follow-up: No follow-ups on file.    Kasandra Knudsen Mayers, PA-C

## 2020-11-16 NOTE — Progress Notes (Unsigned)
Patient complains of lipoma that is increasing in size. Patient has taken medication today and patient has taken medication.

## 2020-11-17 ENCOUNTER — Encounter: Payer: Self-pay | Admitting: Physician Assistant

## 2020-11-17 DIAGNOSIS — R222 Localized swelling, mass and lump, trunk: Secondary | ICD-10-CM | POA: Insufficient documentation

## 2020-11-17 DIAGNOSIS — L309 Dermatitis, unspecified: Secondary | ICD-10-CM | POA: Insufficient documentation

## 2020-11-17 NOTE — Patient Instructions (Signed)
No AVS created, patient declines MyChart

## 2020-11-23 ENCOUNTER — Telehealth: Payer: Self-pay | Admitting: *Deleted

## 2020-11-23 DIAGNOSIS — R222 Localized swelling, mass and lump, trunk: Secondary | ICD-10-CM

## 2020-11-23 NOTE — Telephone Encounter (Signed)
[place] ordered for patient

## 2020-12-31 NOTE — Progress Notes (Deleted)
   Subjective:    Patient ID: Kristopher Kim, male    DOB: November 09, 1986, 35 y.o.   MRN: [id number]  35 y.o.M here to est PCP Saw Mayers in Dec 2021   1. Dermatitis Trial Mycolog, patient education given on keeping area clean and dry - nystatin-triamcinolone ointment (MYCOLOG); Apply 1 application topically 2 (two) times daily.  Dispense: 30 g; Refill: 0  2. Nodule of skin of back Will order ultrasound of nodule, consider referral to general surgery       Review of Systems     Objective:   Physical Exam        Assessment & Plan:

## 2021-01-03 ENCOUNTER — Other Ambulatory Visit: Payer: Self-pay

## 2021-01-03 ENCOUNTER — Ambulatory Visit: Payer: Commercial Managed Care - PPO | Attending: Critical Care Medicine | Admitting: Critical Care Medicine

## 2021-02-03 ENCOUNTER — Encounter: Payer: Self-pay | Admitting: Physician Assistant

## 2021-02-09 NOTE — Telephone Encounter (Signed)
Reached out to patient by phone 2x' to schedule a visit with the MMU and LVM to return the a phone call to the Unit.

## 2021-02-21 NOTE — Telephone Encounter (Signed)
Case manager reached out to the patient last week x2 to offer location of MMU. Patient also has a Clinical cytogeneticist message sharing the locations for a walk up appointment.

## 2021-07-13 ENCOUNTER — Other Ambulatory Visit: Payer: Self-pay

## 2021-07-13 ENCOUNTER — Encounter (HOSPITAL_COMMUNITY): Payer: Self-pay | Admitting: Emergency Medicine

## 2021-07-13 ENCOUNTER — Emergency Department (HOSPITAL_COMMUNITY)
Admission: EM | Admit: 2021-07-13 | Discharge: 2021-07-13 | Disposition: A | Discharge status: Home / Self Care | Payer: Commercial Managed Care - PPO | Attending: Emergency Medicine | Admitting: Emergency Medicine

## 2021-07-13 DIAGNOSIS — S0101XA Laceration without foreign body of scalp, initial encounter: Secondary | ICD-10-CM | POA: Diagnosis not present

## 2021-07-13 DIAGNOSIS — W25XXXA Contact with sharp glass, initial encounter: Secondary | ICD-10-CM | POA: Diagnosis not present

## 2021-07-13 DIAGNOSIS — S0990XA Unspecified injury of head, initial encounter: Secondary | ICD-10-CM | POA: Diagnosis present

## 2021-07-13 DIAGNOSIS — Y9389 Activity, other specified: Secondary | ICD-10-CM | POA: Insufficient documentation

## 2021-07-13 DIAGNOSIS — Y990 Civilian activity done for income or pay: Secondary | ICD-10-CM | POA: Insufficient documentation

## 2021-07-13 DIAGNOSIS — Y92811 Bus as the place of occurrence of the external cause: Secondary | ICD-10-CM | POA: Insufficient documentation

## 2021-07-13 DIAGNOSIS — J449 Chronic obstructive pulmonary disease, unspecified: Secondary | ICD-10-CM | POA: Insufficient documentation

## 2021-07-13 DIAGNOSIS — F17210 Nicotine dependence, cigarettes, uncomplicated: Secondary | ICD-10-CM | POA: Diagnosis not present

## 2021-07-13 MED ORDER — DOXYCYCLINE HYCLATE 100 MG PO CAPS: 100.0000 mg | ORAL_CAPSULE | Freq: Two times a day (BID) | ORAL | 0 refills | Status: AC

## 2021-07-13 MED ORDER — LIDOCAINE-EPINEPHRINE-TETRACAINE (LET) TOPICAL GEL
3.0000 mL | Freq: Once | TOPICAL | Status: AC
  Administered 2021-07-13: 3 mL via TOPICAL
  Filled 2021-07-13: qty 3

## 2021-07-13 NOTE — ED Provider Notes (Signed)
Emergency Medicine Provider Triage Evaluation Note  Raun Routh [facility] , a 35 y.o. male  was evaluated in triage.  Pt complains of head injury.  Happened around 4:00.  Tetanus up-to-date.  No pain.  No loss of consciousness.  Injury of the right parietal scalp as well as the lip.  Review of Systems  Positive: Head injury Negative: LOC  Physical Exam  BP 116/85 (BP Location: Left Arm)   Pulse (!) 119   Temp 99.3 F (37.4 C) (Oral)   Resp 18   Ht 6\' 1"  (1.854 m)   Wt 99.8 kg   SpO2 96%   BMI 29.03 kg/m  Gen:   Awake, no distress   Resp:  Normal effort  MSK:   Moves extremities without difficulty  Other:  Abrasion/flap of skin of the R parietal head. Small lac of the L temple. Upper lip lac.   Medical Decision Making  Medically screening exam initiated at 6:06 PM.  Appropriate orders placed.  Pe[place]oy was informed that the remainder of the evaluation will be completed by another provider, this initial triage assessment does not replace that evaluation, and the importance of remaining in the ED until their evaluation is complete.     Lorella Nimrod, PA-C 07/13/21 1807    09/12/21, MD 07/13/21 [phone]

## 2021-07-13 NOTE — ED Notes (Signed)
Pt called for room assignment x1 with no response

## 2021-07-13 NOTE — ED Triage Notes (Addendum)
Pt was at work and was attempting to push a metal tubing up at an incline and it came back and hit him in the head. Pt has lac to R side of head, bleeding controlled. Abrasion to L forehead, and small lac to R lip.

## 2021-07-13 NOTE — Discharge Instructions (Addendum)
Please have your staples removed in 7 days.  I am also prescribing you an antibiotic called doxycycline.  Please take this twice a day for the next 7 days.  If you develop any new or worsening symptoms please come back to the emergency department.  It was a pleasure to meet you.

## 2021-07-13 NOTE — ED Notes (Signed)
Pt called outside and in lobby again. No asnwer and not visible in the lobby

## 2021-07-13 NOTE — ED Provider Notes (Signed)
[facility] EMERGENCY DEPARTMENT Provider Note   CSN: [id number] Arrival date & time: 07/13/21  1727     History Chief Complaint  Patient presents with   Head Laceration    Kristopher Kim is a 35 y.o. male.  HPI Patient is a 35 year old male with a medical history as noted below.  He presents to the emergency department due to a head injury that occurred around 4 PM today.  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  He reports a laceration to the scalp as well as an abrasion to the left forehead and a small laceration to the upper lip.  No active bleeding at this time.  States his Tdap was in the last 5 years.  Patient states that his head was brushed against by the fiberglass panel and was not directly struck.  Denies any LOC or dizziness/lightheadedness following the event.    Past Medical History:  Diagnosis Date   Bipolar 1 disorder (HCC)    COPD (chronic obstructive pulmonary disease) (HCC)    Traumatic brain injury [facility])     Patient Active Problem List   Diagnosis Date Noted   Dermatitis 11/17/2020   Nodule of skin of back 11/17/2020    Past Surgical History:  Procedure Laterality Date   NO PAST SURGERIES         Family History  Problem Relation Age of Onset   Parkinson's disease Paternal Grandmother    Heart attack Paternal Grandfather    Stroke Paternal Grandfather    Healthy Mother    Healthy Father    Cancer Neg Hx     Social History   Tobacco Use   Smoking status: Every Day    Packs/day: 0.50    Years: 25.00    Pack years: 12.50    Types: Cigarettes   Smokeless tobacco: Current  Vaping Use   Vaping Use: Never used  Substance Use Topics   Alcohol use: No    Comment: quit 2016   Drug use: Not Currently    Home Medications Prior to Admission medications   Medication Sig Start Date End Date Taking? Authorizing Provider  doxycycline  (VIBRAMYCIN) 100 MG capsule Take 1 capsule (100 mg total) by mouth 2 (two) times daily for 7 days. 07/13/21 07/20/21 Yes Placido Sou, PA-C  budesonide-formoterol (SYMBICORT) 80-4.5 MCG/ACT inhaler Inhale 1 puff into the lungs as needed (wheezing, shortness of breath).    [provider]  diphenoxylate-atropine (LOMOTIL) 2.5-0.025 MG tablet Take 1 tablet by mouth 4 (four) times daily as needed for diarrhea or loose stools. 04/29/20   LampteyBritta Mccreedy, MD  nystatin-triamcinolone ointment [facility]) Apply 1 application topically 2 (two) times daily. 11/16/20   Mayers, Cari S, PA-C  ondansetron (ZOFRAN ODT) 4 MG disintegrating tablet Take 1 tablet (4 mg total) by mouth every 8 (eight) hours as needed for nausea or vomiting. 04/29/20   Lamptey, Britta Mccreedy, MD    Allergies    Ceclor [cefaclor], Ceclor [cefaclor], Penicillins, and Penicillins  Review of Systems   Review of Systems  Musculoskeletal:  Negative for neck pain.  Skin:  Positive for wound.  Neurological:  Negative for syncope, weakness, light-headedness and numbness.   Physical Exam Updated Vital Signs BP (!) 128/101   Pulse (!) 120   Temp 99.3 F (37.4 C) (Oral)   Resp 18   Ht 6\' 1"  (1.854 m)  Wt 99.8 kg   SpO2 95%   BMI 29.03 kg/m   Physical Exam Vitals and nursing note reviewed.  Constitutional:      General: He is not in acute distress.    Appearance: He is well-developed.  HENT:     Head: Normocephalic.     Comments: 5 to 6 cm avulsion laceration to the superior scalp.  No active bleeding at this time.  Additional abrasion noted to the left forehead.  Small 0.5 cm well approximated laceration to the upper lip.  No active bleeding    Right Ear: Tympanic membrane, ear canal and external ear normal. There is no impacted cerumen.     Left Ear: Tympanic membrane, ear canal and external ear normal. There is no impacted cerumen.     Ears:     Comments: No hemotympanums. Eyes:     General: No scleral icterus.        Right eye: No discharge.        Left eye: No discharge.     Conjunctiva/sclera: Conjunctivae normal.  Neck:     Trachea: No tracheal deviation.  Cardiovascular:     Rate and Rhythm: Normal rate.  Pulmonary:     Effort: Pulmonary effort is normal. No respiratory distress.     Breath sounds: No stridor.  Abdominal:     General: There is no distension.  Musculoskeletal:        General: No swelling or deformity.     Cervical back: Neck supple.  Skin:    General: Skin is warm and dry.     Findings: No rash.  Neurological:     Mental Status: He is alert.     Cranial Nerves: Cranial nerve deficit: no gross deficits.   ED Results / Procedures / Treatments   Labs (all labs ordered are listed, but only abnormal results are displayed) Labs Reviewed - No data to display  EKG None  Radiology No results found.  Procedures .Marland KitchenLaceration Repair  Date/Time: 07/14/2021 4:31 PM Performed by: Placido Sou, PA-C Authorized by: Placido Sou, PA-C   Consent:    Consent obtained:  Verbal   Consent given by:  Patient   Risks discussed:  Infection, need for additional repair, pain, poor cosmetic result and poor wound healing   Alternatives discussed:  No treatment and delayed treatment Universal protocol:    Procedure explained and questions answered to patient or proxy's satisfaction: yes     Relevant documents present and verified: yes     Test results available: yes     Imaging studies available: yes     Required blood products, implants, devices, and special equipment available: yes     Site/side marked: yes     Immediately prior to procedure, a time out was called: yes     Patient identity confirmed:  Verbally with patient Anesthesia:    Anesthesia method:  Local infiltration Laceration details:    Location:  Scalp   Scalp location:  Crown   Length (cm):  6 Exploration:    Wound exploration: wound explored through full range of motion   Treatment:    Area cleansed with:   Saline   Amount of cleaning:  Extensive   Irrigation solution:  Sterile saline   Irrigation method:  Pressure wash   Debridement:  None   Undermining:  None Skin repair:    Repair method:  Staples   Number of staples:  6 Approximation:    Approximation:  Close Repair type:  Repair type:  Intermediate Post-procedure details:    Dressing:  Open (no dressing)   Procedure completion:  Tolerated well, no immediate complications   Medications Ordered in ED Medications  lidocaine-EPINEPHrine-tetracaine (LET) topical gel (3 mLs Topical Given 07/13/21 2213)   ED Course  I have reviewed the triage vital signs and the nursing notes.  Pertinent labs & imaging results that were available during my care of the patient were reviewed by me and considered in my medical decision making (see chart for details).    MDM Rules/Calculators/A&P                          Pt is a 35 y.o. male who presents to the emergency department due to a laceration to the crown of his head.  Patient had a 6 cm avulsion to the crown of his head.  No active bleeding at this time.  Wound cleaned extensively with sterile saline.  I removed a large amount of hair from the region and was able to staple the avulsion with 6 staples.  Patient tolerated the procedure well.  We discussed wound care in length.  We will discharge him on a course of doxycycline to help prevent infection.  Recommended staple removal in about 1 week.  Patient also notes an abrasion to the left forehead as well as a small laceration to the upper lip.  The laceration to the upper lip does not appear amenable to sutures.  Patient states his Tdap is up-to-date.  Physical exam otherwise reassuring.  Patient states that he was grazed by the fiberglass while at work today but was not directly struck to the head.  Denies any lightheadedness, dizziness, syncope, or near syncope.  No numbness or weakness.  He is A&O x4 and is moving all 4 extremities with ease.   Ambulating with a steady gait.  Did not feel that further imaging was necessary at this time and patient was agreeable.  Feel the patient is stable for discharge at this time and he is agreeable.  His questions were answered and he was amicable at the time of discharge.  Note: Portions of this report may have been transcribed using voice recognition software. Every effort was made to ensure accuracy; however, inadvertent computerized transcription errors may be present.   Final Clinical Impression(s) / ED Diagnoses Final diagnoses:  Laceration of scalp, initial encounter  Injury of head, initial encounter   Rx / DC Orders ED Discharge Orders          Ordered    doxycycline (VIBRAMYCIN) 100 MG capsule  2 times daily        07/13/21 2321             Placido Sou, PA-C 07/14/21 1638    Long, Arlyss Repress, MD 07/19/21 [phone]

## 2021-07-20 ENCOUNTER — Ambulatory Visit (HOSPITAL_COMMUNITY)
Admission: EM | Admit: 2021-07-20 | Discharge: 2021-07-20 | Disposition: A | Discharge status: Home / Self Care | Payer: Commercial Managed Care - PPO

## 2021-07-20 ENCOUNTER — Other Ambulatory Visit: Payer: Self-pay

## 2021-07-20 DIAGNOSIS — Z4802 Encounter for removal of sutures: Secondary | ICD-10-CM

## 2021-07-20 NOTE — ED Triage Notes (Signed)
Pt in for staple removal.  Wound well healed and approximated.

## 2021-07-20 NOTE — ED Notes (Signed)
6 staples removed from scalp. Pt tolerated well.

## 2022-08-04 ENCOUNTER — Encounter (HOSPITAL_COMMUNITY): Payer: Self-pay | Admitting: Emergency Medicine

## 2022-08-04 ENCOUNTER — Ambulatory Visit (HOSPITAL_COMMUNITY)
Admission: EM | Admit: 2022-08-04 | Discharge: 2022-08-04 | Disposition: A | Discharge status: Home / Self Care | Payer: Commercial Managed Care - PPO | Attending: Family Medicine | Admitting: Family Medicine

## 2022-08-04 DIAGNOSIS — S61011A Laceration without foreign body of right thumb without damage to nail, initial encounter: Secondary | ICD-10-CM | POA: Diagnosis not present

## 2022-08-04 DIAGNOSIS — S61001A Unspecified open wound of right thumb without damage to nail, initial encounter: Secondary | ICD-10-CM

## 2022-08-04 MED ORDER — OXYMETAZOLINE HCL 0.05 % NA SOLN
NASAL | Status: AC
  Filled 2022-08-04: qty 30

## 2022-08-04 MED ORDER — SILVER NITRATE-POT NITRATE 75-25 % EX MISC
CUTANEOUS | Status: AC
  Filled 2022-08-04: qty 10

## 2022-08-04 NOTE — ED Provider Notes (Signed)
[facility]    CSN: [id number] Arrival date & time: 08/04/22  8341      History   Chief Complaint Chief Complaint  Patient presents with   Laceration    HPI Kristopher Kim is a 36 y.o. male.    Laceration Patient is here for laceration.  He has a new mandolin, and used it today.  First he scraped the right wrist, which is not bleeding at this time.  He then used it again and cut off a portion of the tip of the thumb.  He has applied 3 bandages and it continues to bleed;  is very tender;  full rom without limitations.   Past Medical History:  Diagnosis Date   Bipolar 1 disorder (HCC)    COPD (chronic obstructive pulmonary disease) (HCC)    Traumatic brain injury [facility])     Patient Active Problem List   Diagnosis Date Noted   Dermatitis 11/17/2020   Nodule of skin of back 11/17/2020    Past Surgical History:  Procedure Laterality Date   NO PAST SURGERIES         Home Medications    Prior to Admission medications   Medication Sig Start Date End Date Taking? Authorizing Provider  budesonide-formoterol (SYMBICORT) 80-4.5 MCG/ACT inhaler Inhale 1 puff into the lungs as needed (wheezing, shortness of breath).    [provider]  diphenoxylate-atropine (LOMOTIL) 2.5-0.025 MG tablet Take 1 tablet by mouth 4 (four) times daily as needed for diarrhea or loose stools. 04/29/20   LampteyBritta Mccreedy, MD  nystatin-triamcinolone ointment [facility]) Apply 1 application topically 2 (two) times daily. 11/16/20   Mayers, Cari S, PA-C  ondansetron (ZOFRAN ODT) 4 MG disintegrating tablet Take 1 tablet (4 mg total) by mouth every 8 (eight) hours as needed for nausea or vomiting. 04/29/20   Lamptey, Britta Mccreedy, MD    Family History Family History  Problem Relation Age of Onset   Parkinson's disease Paternal Grandmother    Heart attack Paternal Grandfather    Stroke Paternal Grandfather    Healthy Mother    Healthy Father    Cancer Neg Hx     Social  History Social History   Tobacco Use   Smoking status: Every Day    Packs/day: 0.50    Years: 25.00    Total pack years: 12.50    Types: Cigarettes   Smokeless tobacco: Current  Vaping Use   Vaping Use: Never used  Substance Use Topics   Alcohol use: No    Comment: quit 2016   Drug use: Not Currently     Allergies   Ceclor [cefaclor], Ceclor [cefaclor], Penicillins, and Penicillins   Review of Systems Review of Systems  Constitutional: Negative.   HENT: Negative.    Respiratory: Negative.    Gastrointestinal: Negative.   Genitourinary: Negative.   Skin:  Positive for wound.     Physical Exam Triage Vital Signs ED Triage Vitals  Enc Vitals Group     BP 08/04/22 0925 124/76     Pulse Rate 08/04/22 0925 71     Resp 08/04/22 0925 18     Temp 08/04/22 0925 98.6 F (37 C)     Temp Source 08/04/22 0925 Oral     SpO2 08/04/22 0925 96 %     Weight --      Height --      Head Circumference --      Peak Flow --      Pain Score 08/04/22  0924 0     Pain Loc --      Pain Edu? --      Excl. in GC? --    No data found.  Updated Vital Signs BP 124/76 (BP Location: Left Arm)   Pulse 71   Temp 98.6 F (37 C) (Oral)   Resp 18   SpO2 96%   Visual Acuity Right Eye Distance:   Left Eye Distance:   Bilateral Distance:    Right Eye Near:   Left Eye Near:    Bilateral Near:     Physical Exam Constitutional:      Appearance: Normal appearance.  Skin:    General: Skin is warm.     Comments: Bandage applied to the right wrist;  no active bleeding noted.   He has a  gauze applied to the right thumb;  the gauze has blood noted;  Gauze was removed over the sink, and the the lateral tip of the thumb has been shaved off with active bleeding noted;   Neurological:     General: No focal deficit present.     Mental Status: He is alert.  Psychiatric:        Mood and Affect: Mood normal.      UC Treatments / Results  Labs (all labs ordered are listed, but only  abnormal results are displayed) Labs Reviewed - No data to display  EKG   Radiology No results found.  Procedures The right thumb was cleaned with betadine, and soaked in an afrin soaked gauze for 15 mins.  Afterward there was a small amount of bleeding.   Silver nitrate was applied.  There was still a small amount of bleeding noted after application, but controlled with pressure dressing;  patient tolerated this okay.    The palm laceration was also cleaned today,  abx ointment applied and dressing applied.   Medications Ordered in UC Medications - No data to display  Initial Impression / Assessment and Plan / UC Course  I have reviewed the triage vital signs and the nursing notes.  Pertinent labs & imaging results that were available during my care of the patient were reviewed by me and considered in my medical decision making (see chart for details).    Patient was seen today for avulsion of the tip of the right thumb.  He was bleeding profusely upon arrival.  We used an afrin soak to help with bleeding in order to apply silver nitrate.  This helped further with bleeding control, although some bleeding was still noted;  This was quite painful, but the patient declined further anesthesia.   Pressure dressing was applied to the thumb.  Advised to monitor for further bleeding, and if needed please go to the ER for further care and treatment.   Final Clinical Impressions(s) / UC Diagnoses   Final diagnoses:  Laceration of right thumb without foreign body without damage to nail, initial encounter  Avulsion of skin of right thumb, initial encounter     Discharge Instructions      You were seen today for skin avulsion of the right thumb.  We have cleaned it, and helped reduce the bleeding.  We have applied a pressure dressing to help further control bleeding.  Please monitor this over the day.  If it continues to bleed, soaking through the guaze, then please go to the ER for  further treatment.  When able, please apply an antibiotic topical ointment such as bactroban.  ED Prescriptions   None    PDMP not reviewed this encounter.   Jannifer Franklin, MD 08/04/22 1030

## 2022-08-04 NOTE — ED Triage Notes (Signed)
Pt got new mandolin and was cutting cucumbers and cut palm of right hand first then cut right thumb.

## 2022-08-04 NOTE — Discharge Instructions (Signed)
You were seen today for skin avulsion of the right thumb.  We have cleaned it, and helped reduce the bleeding.  We have applied a pressure dressing to help further control bleeding.  Please monitor this over the day.  If it continues to bleed, soaking through the guaze, then please go to the ER for further treatment.  When able, please apply an antibiotic topical ointment such as bactroban.

## 2022-09-19 ENCOUNTER — Telehealth (HOSPITAL_COMMUNITY): Payer: Self-pay

## 2022-09-19 ENCOUNTER — Ambulatory Visit (HOSPITAL_COMMUNITY)
Admission: EM | Admit: 2022-09-19 | Discharge: 2022-09-19 | Disposition: A | Discharge status: Home / Self Care | Payer: Commercial Managed Care - PPO | Attending: Physician Assistant | Admitting: Physician Assistant

## 2022-09-19 ENCOUNTER — Encounter (HOSPITAL_COMMUNITY): Payer: Self-pay | Admitting: Emergency Medicine

## 2022-09-19 DIAGNOSIS — R21 Rash and other nonspecific skin eruption: Secondary | ICD-10-CM | POA: Diagnosis not present

## 2022-09-19 DIAGNOSIS — W57XXXA Bitten or stung by nonvenomous insect and other nonvenomous arthropods, initial encounter: Secondary | ICD-10-CM | POA: Diagnosis not present

## 2022-09-19 DIAGNOSIS — S80862A Insect bite (nonvenomous), left lower leg, initial encounter: Secondary | ICD-10-CM

## 2022-09-19 MED ORDER — TRIAMCINOLONE ACETONIDE 0.1 % EX CREA: 1.0000 | TOPICAL_CREAM | Freq: Two times a day (BID) | CUTANEOUS | 1 refills | Status: AC

## 2022-09-19 MED ORDER — LORATADINE 10 MG PO TABS: 10.0000 mg | ORAL_TABLET | Freq: Every day | ORAL | 0 refills | Status: AC

## 2022-09-19 NOTE — Telephone Encounter (Signed)
Patient calling and states the pharmacy informed them that they did not have the medication.  Called the pharmacy and they verified that the medication is now ready to be picked up.  Called and informed the Patient that both medications are ready at the [facility] on [address]. Patient verbalized understanding

## 2022-09-19 NOTE — Discharge Instructions (Addendum)
Advised to take Benadryl at night for itching. Advised take Claritin 10 mg daily to help reduce itching.  Advised to apply the triamcinolone cream 3 times a day to help reduce the inflammatory response from the bites.   Advised to follow-up PCP or return to urgent care if symptoms fail to improve.

## 2022-09-19 NOTE — ED Triage Notes (Signed)
Pt reports 3 nights ago started having rash on left leg.   Wanted testing for herpes testing due to health clinic doesn't test for that and told pt needed testing either at [facility] or PCP. Pt doesn't have PCP.

## 2022-09-19 NOTE — ED Provider Notes (Signed)
[facility]    CSN: [id number] Arrival date & time: 09/19/22  1545      History   Chief Complaint Chief Complaint  Patient presents with   Rash    HPI Kristopher Kim is a 36 y.o. male.   36 year old male presents with rash on the left leg.  Patient indicates for the past 3 days he has been having a rash on his left leg and left side.  He relates that it itches and is worse with itching when he gets out of a hot shower.  Patient indicates he does not have the rash anywhere on the trunk, upper arms, or back.  He indicates that he and his wife did get a brand-new mattress and are using new sheets but they have had that for the past week or so.  He indicates that he has not been in any fields, hot tubs, and he relates that his dog is usually inside but does go outside and has not brought any fleas.  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  Patient relates that he cannot pin down where he may have picked up the rash at since it is mainly confined to the left hip area and lower leg.  He denies any fever or chills. He also indicates that he wanted to be tested for herpes however he does not have any active outbreak, no penile discharge.  He desired to have blood drawn to test for herpes however he was counseled and advised that the blood test is not an accurate test and there is commonly false positives with that particular test.  He has been advised that the gold standard for testing for herpes with a viral culture of an active outbreak.  He relates that he has not had any active outbreaks.   Rash   Past Medical History:  Diagnosis Date   Bipolar 1 disorder ([place])    COPD (chronic obstructive pulmonary disease) ([place])    Traumatic brain injury [facility])     Patient Active Problem List   Diagnosis Date Noted   Dermatitis  11/17/2020   Nodule of skin of back 11/17/2020    Past Surgical History:  Procedure Laterality Date   NO PAST SURGERIES         Home Medications    Prior to Admission medications   Medication Sig Start Date End Date Taking? Authorizing Provider  loratadine (CLARITIN) 10 MG tablet Take 1 tablet (10 mg total) by mouth daily. 09/19/22  Yes Nyoka Lint, PA-C  triamcinolone cream (KENALOG) 0.1 % Apply 1 Application topically 2 (two) times daily. 09/19/22  Yes Nyoka Lint, PA-C    Family History Family History  Problem Relation Age of Onset   Parkinson's disease Paternal Grandmother    Heart attack Paternal Grandfather    Stroke Paternal Grandfather    Healthy Mother    Healthy Father    Cancer Neg Hx     Social History Social History   Tobacco Use   Smoking status: Every Day    Packs/day: 0.50    Years: 25.00    Total pack years: 12.50    Types: Cigarettes   Smokeless tobacco: Current  Vaping Use   Vaping Use: Never used  Substance Use Topics   Alcohol use: No    Comment: quit 2016  Drug use: Not Currently     Allergies   Ceclor [cefaclor], Ceclor [cefaclor], Penicillins, and Penicillins   Review of Systems Review of Systems  Skin:  Positive for rash (itching).     Physical Exam Triage Vital Signs ED Triage Vitals  Enc Vitals Group     BP 09/19/22 1606 (!) 133/95     Pulse Rate 09/19/22 1606 63     Resp 09/19/22 1606 17     Temp 09/19/22 1606 98.8 F (37.1 C)     Temp Source 09/19/22 1606 Oral     SpO2 09/19/22 1606 97 %     Weight --      Height --      Head Circumference --      Peak Flow --      Pain Score 09/19/22 1605 0     Pain Loc --      Pain Edu? --      Excl. in [place]? --    No data found.  Updated Vital Signs BP (!) 133/95 (BP Location: Left Arm)   Pulse 63   Temp 98.8 F (37.1 C) (Oral)   Resp 17   SpO2 97%   Visual Acuity Right Eye Distance:   Left Eye Distance:   Bilateral Distance:    Right Eye Near:   Left Eye  Near:    Bilateral Near:     Physical Exam Constitutional:      Appearance: Normal appearance.  Skin:    Comments: Skin: The left hip area anterior posterior and the left lower leg have a diffusely scattered papular red eruption present, there is some tracking of the lesions.  There is no active drainage.  This rash appears like it may be a rash from an insect such as possibly bedbugs or similar type mite.  Neurological:     Mental Status: He is alert.      UC Treatments / Results  Labs (all labs ordered are listed, but only abnormal results are displayed) Labs Reviewed - No data to display  EKG   Radiology No results found.  Procedures Procedures (including critical care time)  Medications Ordered in UC Medications - No data to display  Initial Impression / Assessment and Plan / UC Course  I have reviewed the triage vital signs and the nursing notes.  Pertinent labs & imaging results that were available during my care of the patient were reviewed by me and considered in my medical decision making (see chart for details).    Plan: 1.  The rash will be treated with the following: A.  Claritin 10 mg daily to help control itching and inflammation. 2.  The insect bites will be treated with the following: A.  Triamcinolone cream 0.1% applied to the areas 3 times a day. B.  Claritin 10 mg daily to help control itching and inflammation. C.  Benadryl 25 mg suggested at night to help get a good night sleep. 3.  Patient advised to observe his environment and how he is laying on his left side and to check bedding for bedbugs. 4.  Patient advised follow-up PCP or return to urgent care if symptoms fail to improve. Final Clinical Impressions(s) / UC Diagnoses   Final diagnoses:  Rash and nonspecific skin eruption  Insect bite of left lower leg, initial encounter     Discharge Instructions      Advised to take Benadryl at night for itching. Advised take Claritin 10 mg daily  to help reduce  itching.  Advised to apply the triamcinolone cream 3 times a day to help reduce the inflammatory response from the bites.   Advised to follow-up PCP or return to urgent care if symptoms fail to improve.    ED Prescriptions     Medication Sig Dispense Auth. Provider   triamcinolone cream (KENALOG) 0.1 % Apply 1 Application topically 2 (two) times daily. 80 g Nyoka Lint, PA-C   loratadine (CLARITIN) 10 MG tablet Take 1 tablet (10 mg total) by mouth daily. 10 tablet Nyoka Lint, PA-C      PDMP not reviewed this encounter.   Nyoka Lint, PA-C 09/19/22 1640

## 2022-11-06 ENCOUNTER — Ambulatory Visit
Admission: RE | Admit: 2022-11-06 | Discharge: 2022-11-06 | Disposition: A | Discharge status: Home / Self Care | Payer: Commercial Managed Care - PPO | Source: Ambulatory Visit

## 2022-11-06 VITALS — BP 106/71 | HR 63 | Temp 99.7°F | Resp 18 | Ht 73.0 in | Wt 195.0 lb

## 2022-11-06 DIAGNOSIS — R059 Cough, unspecified: Secondary | ICD-10-CM | POA: Diagnosis not present

## 2022-11-06 DIAGNOSIS — R509 Fever, unspecified: Secondary | ICD-10-CM

## 2022-11-06 MED ORDER — AZITHROMYCIN 250 MG PO TABS: 250.0000 mg | ORAL_TABLET | Freq: Every day | ORAL | 0 refills | Status: AC

## 2022-11-06 MED ORDER — BENZONATATE 200 MG PO CAPS: 200.0000 mg | ORAL_CAPSULE | Freq: Three times a day (TID) | ORAL | 0 refills | Status: AC | PRN

## 2022-11-06 NOTE — ED Triage Notes (Signed)
Patient c/o cough, nasal drainage, sore throat, fever, fatigue x 3 days.  Patient has taken Ibuprofen today.  Patient's temp was 101 today.  Body aches and chills.

## 2022-11-06 NOTE — ED Provider Notes (Signed)
[facility]    CSN: [id number] Arrival date & time: 11/06/22  1846      History   Chief Complaint Chief Complaint  Patient presents with   Cough    Possible adult RSV with Flu - Entered by patient    HPI Kristopher Kim is a 36 y.o. male.   HPI 36 year old male presents with cough cough nasal drainage, sore throat, fever, fatigue x 3 days.  Patient also complains of bodyaches and chills.  PMH significant for TBI, COPD, and bipolar 1 disorder.  Past Medical History:  Diagnosis Date   Bipolar 1 disorder (HCC)    COPD (chronic obstructive pulmonary disease) (HCC)    Traumatic brain injury [facility])     Patient Active Problem List   Diagnosis Date Noted   Dermatitis 11/17/2020   Nodule of skin of back 11/17/2020    Past Surgical History:  Procedure Laterality Date   NO PAST SURGERIES         Home Medications    Prior to Admission medications   Medication Sig Start Date End Date Taking? Authorizing Provider  azithromycin (ZITHROMAX) 250 MG tablet Take 1 tablet (250 mg total) by mouth daily. Take first 2 tablets together, then 1 every day until finished. 11/06/22  Yes Trevor Iha, FNP  benzonatate (TESSALON) 200 MG capsule Take 1 capsule (200 mg total) by mouth 3 (three) times daily as needed for up to 7 days. 11/06/22 11/13/22 Yes Trevor Iha, FNP  buPROPion (WELLBUTRIN SR) 150 MG 12 hr tablet Take 150 mg by mouth 2 (two) times daily. 10/20/22  Yes [provider]  hydrOXYzine (VISTARIL) 25 MG capsule Take 25 mg by mouth 2 (two) times daily. 10/20/22  Yes [provider]  loratadine (CLARITIN) 10 MG tablet Take 1 tablet (10 mg total) by mouth daily. 09/19/22   Ellsworth Lennox, PA-C  triamcinolone cream (KENALOG) 0.1 % Apply 1 Application topically 2 (two) times daily. 09/19/22   Ellsworth Lennox, PA-C    Family History Family History  Problem Relation Age of Onset   Parkinson's disease Paternal Grandmother    Heart attack Paternal  Grandfather    Stroke Paternal Grandfather    Healthy Mother    Healthy Father    Cancer Neg Hx     Social History Social History   Tobacco Use   Smoking status: Every Day    Packs/day: 0.50    Years: 25.00    Total pack years: 12.50    Types: Cigarettes   Smokeless tobacco: Current  Vaping Use   Vaping Use: Never used  Substance Use Topics   Alcohol use: No    Comment: quit 2016   Drug use: Not Currently     Allergies   Ceclor [cefaclor], Ceclor [cefaclor], Penicillins, and Penicillins   Review of Systems Review of Systems  Constitutional:  Positive for chills, fatigue and fever.  HENT:  Positive for congestion and sore throat.   Respiratory:  Positive for cough.   All other systems reviewed and are negative.    Physical Exam Triage Vital Signs ED Triage Vitals  Enc Vitals Group     BP 11/06/22 1859 106/71     Pulse Rate 11/06/22 1859 63     Resp 11/06/22 1859 18     Temp 11/06/22 1859 99.7 F (37.6 C)     Temp Source 11/06/22 1859 Oral     SpO2 11/06/22 1859 98 %     Weight 11/06/22 1900 195 lb (88.5 kg)  Height 11/06/22 1900 6\' 1"  (1.854 m)     Head Circumference --      Peak Flow --      Pain Score 11/06/22 1900 4     Pain Loc --      Pain Edu? --      Excl. in [place]? --    No data found.  Updated Vital Signs BP 106/71 (BP Location: Right Arm)   Pulse 63   Temp 99.7 F (37.6 C) (Oral)   Resp 18   Ht 6\' 1"  (1.854 m)   Wt 195 lb (88.5 kg)   SpO2 98%   BMI 25.73 kg/m       Physical Exam Vitals and nursing note reviewed.  Constitutional:      Appearance: Normal appearance. He is normal weight. He is ill-appearing.  HENT:     Head: Normocephalic and atraumatic.     Right Ear: Tympanic membrane, ear canal and external ear normal.     Left Ear: Tympanic membrane, ear canal and external ear normal.     Mouth/Throat:     Mouth: Mucous membranes are moist.     Pharynx: Oropharynx is clear.  Eyes:     Extraocular Movements: Extraocular  movements intact.     Conjunctiva/sclera: Conjunctivae normal.     Pupils: Pupils are equal, round, and reactive to light.  Cardiovascular:     Rate and Rhythm: Normal rate and regular rhythm.     Pulses: Normal pulses.     Heart sounds: Normal heart sounds.  Pulmonary:     Effort: Pulmonary effort is normal.     Breath sounds: Normal breath sounds. No wheezing, rhonchi or rales.     Comments: Infrequent nonproductive cough noted on exam Musculoskeletal:        General: Normal range of motion.     Cervical back: Normal range of motion and neck supple. No tenderness.  Lymphadenopathy:     Cervical: No cervical adenopathy.  Skin:    General: Skin is warm and dry.  Neurological:     General: No focal deficit present.     Mental Status: He is alert and oriented to person, place, and time. Mental status is at baseline.      UC Treatments / Results  Labs (all labs ordered are listed, but only abnormal results are displayed) Labs Reviewed  RESP PANEL BY RT-PCR (FLU A&B, COVID) [id number]    EKG   Radiology No results found.  Procedures Procedures (including critical care time)  Medications Ordered in UC Medications - No data to display  Initial Impression / Assessment and Plan / UC Course  I have reviewed the triage vital signs and the nursing notes.  Pertinent labs & imaging results that were available during my care of the patient were reviewed by me and considered in my medical decision making (see chart for details).     MDM: 1.  Cough-Rx Zithromax, Tessalon Perles; 2.  Fever-lab 11/08/22 ordered. Advised patient as directed with food to completion.  Advised may take Tessalon daily as needed for cough.  Advised may take OTC Tylenol 1000 to 4000 mg daily, as needed for fever.  Encouraged patient to increase daily water intake to 64 ounces per day while taking these medications.  Advised we will follow-up with COVID-19/influenza results once received.  Patient discharged home,  hemodynamically stable. Final Clinical Impressions(s) / UC Diagnoses   Final diagnoses:  Cough, unspecified type  Fever, unspecified     Discharge Instructions  Advised patient as directed with food to completion.  Advised may take Tessalon daily as needed for cough.  Advised may take OTC Tylenol 1000 to 4000 mg daily, as needed for fever.  Encouraged patient to increase daily water intake to 64 ounces per day while taking these medications.  Advised we will follow-up with COVID-19/influenza results once received.     ED Prescriptions     Medication Sig Dispense Auth. Provider   azithromycin (ZITHROMAX) 250 MG tablet Take 1 tablet (250 mg total) by mouth daily. Take first 2 tablets together, then 1 every day until finished. 6 tablet Trevor Iha, FNP   benzonatate (TESSALON) 200 MG capsule Take 1 capsule (200 mg total) by mouth 3 (three) times daily as needed for up to 7 days. 40 capsule Trevor Iha, FNP      PDMP not reviewed this encounter.   Trevor Iha, FNP 11/06/22 1952

## 2022-11-06 NOTE — Discharge Instructions (Addendum)
Advised patient as directed with food to completion.  Advised may take Tessalon daily as needed for cough.  Advised may take OTC Tylenol 1000 to 4000 mg daily, as needed for fever.  Encouraged patient to increase daily water intake to 64 ounces per day while taking these medications.  Advised we will follow-up with COVID-19/influenza results once received.

## 2022-11-07 ENCOUNTER — Telehealth: Payer: Self-pay | Admitting: Emergency Medicine

## 2022-11-07 DIAGNOSIS — J111 Influenza due to unidentified influenza virus with other respiratory manifestations: Secondary | ICD-10-CM

## 2022-11-07 LAB — RESP PANEL BY RT-PCR (FLU A&B, COVID) ARPGX2
Influenza A by PCR: POSITIVE — AB
Influenza B by PCR: NEGATIVE
SARS Coronavirus 2 by RT PCR: NEGATIVE

## 2022-11-07 MED ORDER — OSELTAMIVIR PHOSPHATE 75 MG PO CAPS: 75.0000 mg | ORAL_CAPSULE | Freq: Two times a day (BID) | ORAL | 0 refills | Status: AC

## 2022-11-07 NOTE — Telephone Encounter (Signed)
Unable to leave a message to inform pt that he is positive for flu. Message sent via My chart

## 2022-11-07 NOTE — Addendum Note (Signed)
Addended by: Amil Amen on: 11/07/2022 02:55 PM   Modules accepted: Orders

## 2022-12-25 ENCOUNTER — Ambulatory Visit (INDEPENDENT_AMBULATORY_CARE_PROVIDER_SITE_OTHER): Payer: 59

## 2022-12-25 ENCOUNTER — Ambulatory Visit (HOSPITAL_COMMUNITY)
Admission: RE | Admit: 2022-12-25 | Discharge: 2022-12-25 | Disposition: A | Discharge status: Home / Self Care | Payer: 59 | Source: Ambulatory Visit | Attending: Physician Assistant | Admitting: Physician Assistant

## 2022-12-25 ENCOUNTER — Encounter (HOSPITAL_COMMUNITY): Payer: Self-pay

## 2022-12-25 VITALS — BP 134/92 | HR 99 | Temp 98.3°F | Resp 12

## 2022-12-25 DIAGNOSIS — M79642 Pain in left hand: Secondary | ICD-10-CM | POA: Diagnosis not present

## 2022-12-25 DIAGNOSIS — R2232 Localized swelling, mass and lump, left upper limb: Secondary | ICD-10-CM | POA: Diagnosis not present

## 2022-12-25 MED ORDER — DICLOFENAC SODIUM 75 MG PO TBEC: 75.0000 mg | DELAYED_RELEASE_TABLET | Freq: Two times a day (BID) | ORAL | 0 refills | Status: AC

## 2022-12-25 NOTE — ED Triage Notes (Signed)
Pt is here for pain the left hand x few month causing some pain swelling

## 2022-12-25 NOTE — ED Provider Notes (Signed)
[facility]    CSN: [id number] Arrival date & time: 12/25/22  1557      History   Chief Complaint Chief Complaint  Patient presents with   Extremity Pain    Entered by patient    HPI Norah Fick is a 37 y.o. male.   Pt complains of pain in his left hand at the base of finger and metacarpal area.  Pt reports swelling on and off for 2 months.  Pt has an old injury to finger. (Laceration) and has some decreased use.  Pt reports no new injury.   The history is provided by the patient. No language interpreter was used.  Extremity Pain This is a new problem. The problem has not changed since onset.Nothing aggravates the symptoms. Nothing relieves the symptoms. He has tried nothing for the symptoms. The treatment provided no relief.    Past Medical History:  Diagnosis Date   Bipolar 1 disorder ([place])    COPD (chronic obstructive pulmonary disease) ([place])    Traumatic brain injury [facility])     Patient Active Problem List   Diagnosis Date Noted   Dermatitis 11/17/2020   Nodule of skin of back 11/17/2020    Past Surgical History:  Procedure Laterality Date   NO PAST SURGERIES         Home Medications    Prior to Admission medications   Medication Sig Start Date End Date Taking? Authorizing Provider  buPROPion (WELLBUTRIN SR) 150 MG 12 hr tablet Take 150 mg by mouth 2 (two) times daily. 10/20/22  Yes [provider]  diclofenac (VOLTAREN) 75 MG EC tablet Take 1 tablet (75 mg total) by mouth 2 (two) times daily. 12/25/22  Yes Caryl Ada K, PA-C  hydrOXYzine (VISTARIL) 25 MG capsule Take 25 mg by mouth 2 (two) times daily. 10/20/22  Yes [provider]  azithromycin (ZITHROMAX) 250 MG tablet Take 1 tablet (250 mg total) by mouth daily. Take first 2 tablets together, then 1 every day until finished. 11/06/22   Eliezer Lofts, FNP  loratadine (CLARITIN) 10 MG tablet Take 1 tablet (10 mg total) by mouth daily. 09/19/22   Nyoka Lint, PA-C   oseltamivir (TAMIFLU) 75 MG capsule Take 1 capsule (75 mg total) by mouth every 12 (twelve) hours. 11/07/22   Eliezer Lofts, FNP  triamcinolone cream (KENALOG) 0.1 % Apply 1 Application topically 2 (two) times daily. 09/19/22   Nyoka Lint, PA-C    Family History Family History  Problem Relation Age of Onset   Parkinson's disease Paternal Grandmother    Heart attack Paternal Grandfather    Stroke Paternal Grandfather    Healthy Mother    Healthy Father    Cancer Neg Hx     Social History Social History   Tobacco Use   Smoking status: Every Day    Packs/day: 0.50    Years: 25.00    Total pack years: 12.50    Types: Cigarettes   Smokeless tobacco: Current  Vaping Use   Vaping Use: Never used  Substance Use Topics   Alcohol use: No    Comment: quit 2016   Drug use: Not Currently     Allergies   Ceclor [cefaclor], Ceclor [cefaclor], Penicillins, and Penicillins   Review of Systems Review of Systems  Musculoskeletal:  Positive for joint swelling.  All other systems reviewed and are negative.    Physical Exam Triage Vital Signs ED Triage Vitals  Enc Vitals Group     BP 12/25/22 1634 Marland Kitchen)  134/92     Pulse Rate 12/25/22 1634 99     Resp 12/25/22 1634 12     Temp 12/25/22 1634 98.3 F (36.8 C)     Temp Source 12/25/22 1634 Oral     SpO2 12/25/22 1634 96 %     Weight --      Height --      Head Circumference --      Peak Flow --      Pain Score 12/25/22 1637 8     Pain Loc --      Pain Edu? --      Excl. in [place]? --    No data found.  Updated Vital Signs BP (!) 134/92 (BP Location: Left Arm)   Pulse 99   Temp 98.3 F (36.8 C) (Oral)   Resp 12   SpO2 96%   Visual Acuity Right Eye Distance:   Left Eye Distance:   Bilateral Distance:    Right Eye Near:   Left Eye Near:    Bilateral Near:     Physical Exam Vitals reviewed.  Constitutional:      Appearance: Normal appearance.  Musculoskeletal:        General: Swelling present.     Comments:  Decreased flexion 2nd finger at pip, from at [facility].  Nv and ns intact   Skin:    General: Skin is warm.  Neurological:     General: No focal deficit present.     Mental Status: He is alert.  Psychiatric:        Mood and Affect: Mood normal.      UC Treatments / Results  Labs (all labs ordered are listed, but only abnormal results are displayed) Labs Reviewed - No data to display  EKG   Radiology DG Hand Complete Left  Result Date: 12/25/2022 CLINICAL DATA:  Left hand swelling, pain EXAM: LEFT HAND - COMPLETE 3+ VIEW COMPARISON:  None Available. FINDINGS: There is no evidence of fracture or dislocation. There is no evidence of arthropathy or other focal bone abnormality. Soft tissues are unremarkable. IMPRESSION: Negative. Electronically Signed   By: Rolm Baptise M.D.   On: 12/25/2022 17:00    Procedures Procedures (including critical care time)  Medications Ordered in UC Medications - No data to display  Initial Impression / Assessment and Plan / UC Course  I have reviewed the triage vital signs and the nursing notes.  Pertinent labs & imaging results that were available during my care of the patient were reviewed by me and considered in my medical decision making (see chart for details).     MDM:  I suspect some type of tendonitis.  Pt given rx for voltaren.  Pt advised t follow up with Dr. Jan Fireman on call for hand.  Final Clinical Impressions(s) / UC Diagnoses   Final diagnoses:  Localized swelling on left hand   Discharge Instructions   None    ED Prescriptions     Medication Sig Dispense Auth. Provider   diclofenac (VOLTAREN) 75 MG EC tablet Take 1 tablet (75 mg total) by mouth 2 (two) times daily. 20 tablet Fransico Meadow, [place]      PDMP not reviewed this encounter. An After Visit Summary was printed and given to the patient.    Fransico Meadow, [place] 12/25/22 1737
